# Patient Record
Sex: Female | Born: 1955 | Race: White | Hispanic: No | Marital: Single | Smoking: Current every day smoker
Health system: Southern US, Community
[De-identification: ages and names within clinical notes are randomized; demographics above are authoritative.]

## PROBLEM LIST (undated history)

## (undated) DIAGNOSIS — I10 Essential (primary) hypertension: Secondary | ICD-10-CM

## (undated) DIAGNOSIS — I82409 Acute embolism and thrombosis of unspecified deep veins of unspecified lower extremity: Secondary | ICD-10-CM

## (undated) HISTORY — PX: APPENDECTOMY: SHX54

---

## 2009-05-14 ENCOUNTER — Ambulatory Visit: Payer: Self-pay | Admitting: Internal Medicine

## 2009-08-03 ENCOUNTER — Ambulatory Visit: Payer: Self-pay | Admitting: Family Medicine

## 2011-12-26 DIAGNOSIS — F3130 Bipolar disorder, current episode depressed, mild or moderate severity, unspecified: Secondary | ICD-10-CM | POA: Diagnosis not present

## 2012-04-20 DIAGNOSIS — F3130 Bipolar disorder, current episode depressed, mild or moderate severity, unspecified: Secondary | ICD-10-CM | POA: Diagnosis not present

## 2012-08-19 DIAGNOSIS — F3130 Bipolar disorder, current episode depressed, mild or moderate severity, unspecified: Secondary | ICD-10-CM | POA: Diagnosis not present

## 2013-03-17 DIAGNOSIS — F3130 Bipolar disorder, current episode depressed, mild or moderate severity, unspecified: Secondary | ICD-10-CM | POA: Diagnosis not present

## 2013-07-15 DIAGNOSIS — Z882 Allergy status to sulfonamides status: Secondary | ICD-10-CM | POA: Diagnosis not present

## 2013-07-15 DIAGNOSIS — Z79899 Other long term (current) drug therapy: Secondary | ICD-10-CM | POA: Diagnosis not present

## 2013-07-15 DIAGNOSIS — R109 Unspecified abdominal pain: Secondary | ICD-10-CM | POA: Diagnosis not present

## 2013-07-15 DIAGNOSIS — Z885 Allergy status to narcotic agent status: Secondary | ICD-10-CM | POA: Diagnosis not present

## 2013-07-15 DIAGNOSIS — F319 Bipolar disorder, unspecified: Secondary | ICD-10-CM | POA: Diagnosis not present

## 2013-07-15 DIAGNOSIS — K5000 Crohn's disease of small intestine without complications: Secondary | ICD-10-CM | POA: Diagnosis not present

## 2013-07-15 DIAGNOSIS — I499 Cardiac arrhythmia, unspecified: Secondary | ICD-10-CM | POA: Diagnosis not present

## 2013-07-15 DIAGNOSIS — K5289 Other specified noninfective gastroenteritis and colitis: Secondary | ICD-10-CM | POA: Diagnosis not present

## 2013-07-15 DIAGNOSIS — K37 Unspecified appendicitis: Secondary | ICD-10-CM | POA: Diagnosis not present

## 2013-07-15 DIAGNOSIS — F17200 Nicotine dependence, unspecified, uncomplicated: Secondary | ICD-10-CM | POA: Diagnosis not present

## 2013-07-15 DIAGNOSIS — R1033 Periumbilical pain: Secondary | ICD-10-CM | POA: Diagnosis not present

## 2013-07-17 DIAGNOSIS — R509 Fever, unspecified: Secondary | ICD-10-CM | POA: Diagnosis not present

## 2013-07-17 DIAGNOSIS — F17200 Nicotine dependence, unspecified, uncomplicated: Secondary | ICD-10-CM | POA: Diagnosis not present

## 2013-07-17 DIAGNOSIS — F319 Bipolar disorder, unspecified: Secondary | ICD-10-CM | POA: Insufficient documentation

## 2013-07-17 DIAGNOSIS — K5289 Other specified noninfective gastroenteritis and colitis: Secondary | ICD-10-CM | POA: Diagnosis not present

## 2013-07-17 DIAGNOSIS — K529 Noninfective gastroenteritis and colitis, unspecified: Secondary | ICD-10-CM | POA: Insufficient documentation

## 2013-07-17 DIAGNOSIS — R109 Unspecified abdominal pain: Secondary | ICD-10-CM | POA: Diagnosis not present

## 2013-07-17 DIAGNOSIS — Z79899 Other long term (current) drug therapy: Secondary | ICD-10-CM | POA: Diagnosis not present

## 2013-07-17 DIAGNOSIS — Z886 Allergy status to analgesic agent status: Secondary | ICD-10-CM | POA: Diagnosis not present

## 2013-07-17 DIAGNOSIS — K37 Unspecified appendicitis: Secondary | ICD-10-CM | POA: Diagnosis not present

## 2013-07-17 DIAGNOSIS — Z882 Allergy status to sulfonamides status: Secondary | ICD-10-CM | POA: Diagnosis not present

## 2013-07-17 DIAGNOSIS — Z885 Allergy status to narcotic agent status: Secondary | ICD-10-CM | POA: Diagnosis not present

## 2013-07-18 DIAGNOSIS — K5289 Other specified noninfective gastroenteritis and colitis: Secondary | ICD-10-CM | POA: Diagnosis not present

## 2013-09-14 DIAGNOSIS — F3130 Bipolar disorder, current episode depressed, mild or moderate severity, unspecified: Secondary | ICD-10-CM | POA: Diagnosis not present

## 2013-09-17 DIAGNOSIS — Z8371 Family history of colonic polyps: Secondary | ICD-10-CM | POA: Diagnosis not present

## 2013-09-17 DIAGNOSIS — K625 Hemorrhage of anus and rectum: Secondary | ICD-10-CM | POA: Diagnosis not present

## 2013-09-17 DIAGNOSIS — K5289 Other specified noninfective gastroenteritis and colitis: Secondary | ICD-10-CM | POA: Diagnosis not present

## 2013-11-04 DIAGNOSIS — K5289 Other specified noninfective gastroenteritis and colitis: Secondary | ICD-10-CM | POA: Diagnosis not present

## 2013-11-16 DIAGNOSIS — F3130 Bipolar disorder, current episode depressed, mild or moderate severity, unspecified: Secondary | ICD-10-CM | POA: Diagnosis not present

## 2013-12-13 DIAGNOSIS — E079 Disorder of thyroid, unspecified: Secondary | ICD-10-CM | POA: Diagnosis not present

## 2013-12-13 DIAGNOSIS — D128 Benign neoplasm of rectum: Secondary | ICD-10-CM | POA: Diagnosis not present

## 2013-12-13 DIAGNOSIS — F319 Bipolar disorder, unspecified: Secondary | ICD-10-CM | POA: Diagnosis not present

## 2013-12-13 DIAGNOSIS — F17200 Nicotine dependence, unspecified, uncomplicated: Secondary | ICD-10-CM | POA: Diagnosis not present

## 2013-12-13 DIAGNOSIS — Z1211 Encounter for screening for malignant neoplasm of colon: Secondary | ICD-10-CM | POA: Diagnosis not present

## 2013-12-13 DIAGNOSIS — D126 Benign neoplasm of colon, unspecified: Secondary | ICD-10-CM | POA: Diagnosis not present

## 2013-12-13 DIAGNOSIS — Z9089 Acquired absence of other organs: Secondary | ICD-10-CM | POA: Diagnosis not present

## 2013-12-13 DIAGNOSIS — Z885 Allergy status to narcotic agent status: Secondary | ICD-10-CM | POA: Diagnosis not present

## 2013-12-13 DIAGNOSIS — Z882 Allergy status to sulfonamides status: Secondary | ICD-10-CM | POA: Diagnosis not present

## 2013-12-13 DIAGNOSIS — Z79899 Other long term (current) drug therapy: Secondary | ICD-10-CM | POA: Diagnosis not present

## 2014-05-09 DIAGNOSIS — F3130 Bipolar disorder, current episode depressed, mild or moderate severity, unspecified: Secondary | ICD-10-CM | POA: Diagnosis not present

## 2014-10-19 ENCOUNTER — Ambulatory Visit: Payer: Self-pay | Admitting: Emergency Medicine

## 2014-10-19 DIAGNOSIS — J329 Chronic sinusitis, unspecified: Secondary | ICD-10-CM | POA: Diagnosis not present

## 2014-10-19 DIAGNOSIS — F17210 Nicotine dependence, cigarettes, uncomplicated: Secondary | ICD-10-CM | POA: Diagnosis not present

## 2014-10-19 DIAGNOSIS — J40 Bronchitis, not specified as acute or chronic: Secondary | ICD-10-CM | POA: Diagnosis not present

## 2014-11-02 DIAGNOSIS — Z23 Encounter for immunization: Secondary | ICD-10-CM | POA: Diagnosis not present

## 2014-11-02 DIAGNOSIS — F3131 Bipolar disorder, current episode depressed, mild: Secondary | ICD-10-CM | POA: Diagnosis not present

## 2015-05-03 DIAGNOSIS — F3131 Bipolar disorder, current episode depressed, mild: Secondary | ICD-10-CM | POA: Diagnosis not present

## 2015-05-03 DIAGNOSIS — Z79899 Other long term (current) drug therapy: Secondary | ICD-10-CM | POA: Diagnosis not present

## 2015-07-05 DIAGNOSIS — F3131 Bipolar disorder, current episode depressed, mild: Secondary | ICD-10-CM | POA: Diagnosis not present

## 2015-07-05 DIAGNOSIS — Z79899 Other long term (current) drug therapy: Secondary | ICD-10-CM | POA: Diagnosis not present

## 2015-09-12 DIAGNOSIS — Z23 Encounter for immunization: Secondary | ICD-10-CM | POA: Diagnosis not present

## 2016-01-03 DIAGNOSIS — F3131 Bipolar disorder, current episode depressed, mild: Secondary | ICD-10-CM | POA: Diagnosis not present

## 2016-08-05 DIAGNOSIS — F3131 Bipolar disorder, current episode depressed, mild: Secondary | ICD-10-CM | POA: Diagnosis not present

## 2017-02-04 DIAGNOSIS — F3131 Bipolar disorder, current episode depressed, mild: Secondary | ICD-10-CM | POA: Diagnosis not present

## 2017-07-14 DIAGNOSIS — Z79899 Other long term (current) drug therapy: Secondary | ICD-10-CM | POA: Diagnosis not present

## 2017-07-14 DIAGNOSIS — Z5181 Encounter for therapeutic drug level monitoring: Secondary | ICD-10-CM | POA: Diagnosis not present

## 2017-07-14 DIAGNOSIS — F319 Bipolar disorder, unspecified: Secondary | ICD-10-CM | POA: Diagnosis not present

## 2017-07-14 DIAGNOSIS — F17210 Nicotine dependence, cigarettes, uncomplicated: Secondary | ICD-10-CM | POA: Diagnosis not present

## 2017-07-14 DIAGNOSIS — Z7689 Persons encountering health services in other specified circumstances: Secondary | ICD-10-CM | POA: Diagnosis not present

## 2017-07-14 DIAGNOSIS — Z131 Encounter for screening for diabetes mellitus: Secondary | ICD-10-CM | POA: Diagnosis not present

## 2017-07-14 DIAGNOSIS — Z1159 Encounter for screening for other viral diseases: Secondary | ICD-10-CM | POA: Diagnosis not present

## 2017-07-14 DIAGNOSIS — Z720 Tobacco use: Secondary | ICD-10-CM | POA: Insufficient documentation

## 2017-07-14 DIAGNOSIS — Z114 Encounter for screening for human immunodeficiency virus [HIV]: Secondary | ICD-10-CM | POA: Diagnosis not present

## 2017-07-14 DIAGNOSIS — R221 Localized swelling, mass and lump, neck: Secondary | ICD-10-CM | POA: Diagnosis not present

## 2017-07-14 DIAGNOSIS — Z1231 Encounter for screening mammogram for malignant neoplasm of breast: Secondary | ICD-10-CM | POA: Diagnosis not present

## 2017-07-14 DIAGNOSIS — Z13220 Encounter for screening for lipoid disorders: Secondary | ICD-10-CM | POA: Diagnosis not present

## 2017-08-18 DIAGNOSIS — F3131 Bipolar disorder, current episode depressed, mild: Secondary | ICD-10-CM | POA: Diagnosis not present

## 2021-02-19 ENCOUNTER — Other Ambulatory Visit (INDEPENDENT_AMBULATORY_CARE_PROVIDER_SITE_OTHER): Payer: Self-pay | Admitting: Vascular Surgery

## 2021-02-19 DIAGNOSIS — C9400 Acute erythroid leukemia, not having achieved remission: Secondary | ICD-10-CM

## 2021-02-19 DIAGNOSIS — R0989 Other specified symptoms and signs involving the circulatory and respiratory systems: Secondary | ICD-10-CM

## 2021-02-20 ENCOUNTER — Other Ambulatory Visit: Payer: Self-pay

## 2021-02-20 ENCOUNTER — Ambulatory Visit (INDEPENDENT_AMBULATORY_CARE_PROVIDER_SITE_OTHER): Payer: Medicare PPO

## 2021-02-20 ENCOUNTER — Encounter (INDEPENDENT_AMBULATORY_CARE_PROVIDER_SITE_OTHER): Payer: Self-pay | Admitting: Vascular Surgery

## 2021-02-20 ENCOUNTER — Ambulatory Visit (INDEPENDENT_AMBULATORY_CARE_PROVIDER_SITE_OTHER): Payer: Medicare PPO | Admitting: Vascular Surgery

## 2021-02-20 VITALS — BP 164/67 | HR 75 | Ht 66.0 in | Wt 168.0 lb

## 2021-02-20 DIAGNOSIS — I7025 Atherosclerosis of native arteries of other extremities with ulceration: Secondary | ICD-10-CM | POA: Diagnosis not present

## 2021-02-20 DIAGNOSIS — C9400 Acute erythroid leukemia, not having achieved remission: Secondary | ICD-10-CM

## 2021-02-20 DIAGNOSIS — Z720 Tobacco use: Secondary | ICD-10-CM

## 2021-02-20 DIAGNOSIS — R0989 Other specified symptoms and signs involving the circulatory and respiratory systems: Secondary | ICD-10-CM

## 2021-02-20 NOTE — Assessment & Plan Note (Signed)
Represents an atherosclerotic risk factor and smoking cessation would be of benefit both for reduction of progression of disease as well as maintaining patency of any intervention.

## 2021-02-20 NOTE — Progress Notes (Signed)
Patient ID: Kristen Frazier, female   DOB: [birth year], 65 y.o.   MRN: [id number]  Chief Complaint  Patient presents with  . New Patient (Initial Visit)    Acute  erythematous rt foot decreased pulse U/ and consult referred by Maurilio Lovely    HPI Kristen Frazier is a 65 y.o. female.  I am asked to see the patient by Nigel Mormon, PA-C for evaluation of right foot pain and erythema.  The patient says she dropped a pickle jar on her foot about 6 or 7 months ago and has had pain in that foot ever since.  Over the past few weeks, the pain is intensified.  She has developed erythema and started on antibiotics about a week ago.  When asked if she has pain in her legs with walking prior to this episode, she said she did in fact in her right leg.  This starts after only short distances of walking.  The pain does wake her from sleep.  She does have a history of tobacco use and concern for PAD was present.  ABIs were done today which showed very poor monophasic waveforms on the right with an ABI of 0.4.  She has strong monophasic waveforms on the left but her ABI was still significantly reduced to 0.49.  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   Past Medical History History of tobacco use Asthma Bipolar disorder   Family History No bleeding disorders, clotting disorders, autoimmune diseases, or aneurysms   Social History   Tobacco Use  . Smoking status: Smoker, Current Status Unknown  . Smokeless tobacco: Never Used  no alcohol abuse No IVDU  Allergies  Allergen Reactions  . Codeine Nausea And Vomiting  . Oxycodone-Acetaminophen Nausea And Vomiting  . Sulfa Antibiotics Nausea And Vomiting    Current Outpatient Medications  Medication Sig Dispense Refill  . albuterol (VENTOLIN HFA) 108 (90 Base) MCG/ACT inhaler Inhale into the lungs.    . doxycycline (VIBRA-TABS) 100 MG tablet Take by mouth.    . lamoTRIgine (LAMICTAL) 200 MG tablet Take by mouth.    . QUEtiapine  (SEROQUEL) 400 MG tablet Take by mouth.     No current facility-administered medications for this visit.      REVIEW OF SYSTEMS (Negative unless checked)  Constitutional: [] Weight loss  [] Fever  [] Chills Cardiac: [] Chest pain   [] Chest pressure   [] Palpitations   [] Shortness of breath when laying flat   [] Shortness of breath at rest   [] Shortness of breath with exertion. Vascular:  [] Pain in legs with walking   [] Pain in legs at rest   [] Pain in legs when laying flat   [] Claudication   [x] Pain in feet when walking  [x] Pain in feet at rest  [x] Pain in feet when laying flat   [] History of DVT   [] Phlebitis   [] Swelling in legs   [] Varicose veins   [x] Non-healing ulcers Pulmonary:   [] Uses home oxygen   [] Productive cough   [] Hemoptysis   [] Wheeze  [] COPD   [] Asthma Neurologic:  [] Dizziness  [] Blackouts   [x] Seizures   [] History of stroke   [] History of TIA  [] Aphasia   [] Temporary blindness   [] Dysphagia   [] Weakness or numbness in arms   [] Weakness or numbness in legs Musculoskeletal:  [] Arthritis   [] Joint swelling   [] Joint pain   [] Low back pain Hematologic:  [] Easy bruising  [] Easy bleeding   [] Hypercoagulable state   [] Anemic  [] Hepatitis Gastrointestinal:  [] Blood in stool   []   Vomiting blood  [] Gastroesophageal reflux/heartburn   [] Abdominal pain Genitourinary:  [] Chronic kidney disease   [] Difficult urination  [] Frequent urination  [] Burning with urination   [] Hematuria Skin:  [] Rashes   [x] Ulcers   [x] Wounds Psychological:  [] History of anxiety   [x]  History of major depression.    Physical Exam BP (!) 164/67   Pulse 75   Ht 5\' 6"  (1.676 m)   Wt 168 lb (76.2 kg)   BMI 27.12 kg/m  Gen:  WD/WN, NAD Head: [place]/AT, No temporalis wasting.  Ear/Nose/Throat: Hearing grossly intact, nares w/o erythema or drainage, oropharynx w/o Erythema/Exudate Eyes: Conjunctiva clear, sclera non-icteric  Neck: trachea midline.  No JVD.  Pulmonary:  Good air movement, respirations not labored, no use  of accessory muscles  Cardiac: RRR, no JVD Vascular:  Vessel Right Left  Radial Palpable Palpable                          DP  not palpable  not palpable  PT  not palpable  1+   Gastrointestinal:. No masses, surgical incisions, or scars. Musculoskeletal: M/S 5/5 throughout.  Right foot has significant erythema up to just above the ankle.  Capillary refill is somewhat sluggish on both sides.  Trace lower extremity edema Neurologic: Sensation grossly intact in extremities.  Symmetrical.  Speech is fluent. Motor exam as listed above. Psychiatric: Judgment intact, Mood & affect appropriate for pt's clinical situation. Dermatologic: No rashes or ulcers noted.  No cellulitis or open wounds.    Radiology No results found.  Labs No results found for this or any previous visit (from the past 2160 hour(s)).  Assessment/Plan:  Atherosclerosis of native arteries of the extremities with ulceration (HCC) ABIs were done today which showed very poor monophasic waveforms on the right with an ABI of 0.4.  She has strong monophasic waveforms on the left but her ABI was still significantly reduced to 0.49.  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 The patient has a critical and limb threatening situation with a high risk of limb loss.  Her right leg is in immediate jeopardy.  Her perfusion is fairly reduced on the left as well, but this is not an immediate limb threatening situation as her skin is intact without infection.  I discussed the pathophysiology and natural history of peripheral arterial disease.  I discussed the reason and rationale for treatment and the fact that this is unlikely to improve without revascularization.  I have discussed that if disease is found at the time of angiogram and is amenable, endovascular revascularization will be performed.  Have also discussed the severe disease open surgical therapy may be required as well.  The patient voices her understanding  and is agreeable to proceed.  This will be scheduled in the near future at her convenience.  Tobacco use Represents an atherosclerotic risk factor and smoking cessation would be of benefit both for reduction of progression of disease as well as maintaining patency of any intervention.      Leotis Pain 02/20/2021, 12:19 PM   This note was created with Dragon medical transcription system.  Any errors from dictation are unintentional.

## 2021-02-20 NOTE — H&P (View-Only) (Signed)
Patient ID: Kristen Frazier, female   DOB: 1956-06-12, 65 y.o.   MRN: [id number]  Chief Complaint  Patient presents with  . New Patient (Initial Visit)    Acute  erythematous rt foot decreased pulse U/ and consult referred by Maurilio Lovely    HPI Kristen Frazier is a 65 y.o. female.  I am asked to see the patient by Nigel Mormon, PA-C for evaluation of right foot pain and erythema.  The patient says she dropped a pickle jar on her foot about 6 or 7 months ago and has had pain in that foot ever since.  Over the past few weeks, the pain is intensified.  She has developed erythema and started on antibiotics about a week ago.  When asked if she has pain in her legs with walking prior to this episode, she said she did in fact in her right leg.  This starts after only short distances of walking.  The pain does wake her from sleep.  She does have a history of tobacco use and concern for PAD was present.  ABIs were done today which showed very poor monophasic waveforms on the right with an ABI of 0.4.  She has strong monophasic waveforms on the left but her ABI was still significantly reduced to 0.49.  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   Past Medical History History of tobacco use Asthma Bipolar disorder   Family History No bleeding disorders, clotting disorders, autoimmune diseases, or aneurysms   Social History   Tobacco Use  . Smoking status: Smoker, Current Status Unknown  . Smokeless tobacco: Never Used  no alcohol abuse No IVDU  Allergies  Allergen Reactions  . Codeine Nausea And Vomiting  . Oxycodone-Acetaminophen Nausea And Vomiting  . Sulfa Antibiotics Nausea And Vomiting    Current Outpatient Medications  Medication Sig Dispense Refill  . albuterol (VENTOLIN HFA) 108 (90 Base) MCG/ACT inhaler Inhale into the lungs.    . doxycycline (VIBRA-TABS) 100 MG tablet Take by mouth.    . lamoTRIgine (LAMICTAL) 200 MG tablet Take by mouth.    . QUEtiapine  (SEROQUEL) 400 MG tablet Take by mouth.     No current facility-administered medications for this visit.      REVIEW OF SYSTEMS (Negative unless checked)  Constitutional: [] Weight loss  [] Fever  [] Chills Cardiac: [] Chest pain   [] Chest pressure   [] Palpitations   [] Shortness of breath when laying flat   [] Shortness of breath at rest   [] Shortness of breath with exertion. Vascular:  [] Pain in legs with walking   [] Pain in legs at rest   [] Pain in legs when laying flat   [] Claudication   [x] Pain in feet when walking  [x] Pain in feet at rest  [x] Pain in feet when laying flat   [] History of DVT   [] Phlebitis   [] Swelling in legs   [] Varicose veins   [x] Non-healing ulcers Pulmonary:   [] Uses home oxygen   [] Productive cough   [] Hemoptysis   [] Wheeze  [] COPD   [] Asthma Neurologic:  [] Dizziness  [] Blackouts   [x] Seizures   [] History of stroke   [] History of TIA  [] Aphasia   [] Temporary blindness   [] Dysphagia   [] Weakness or numbness in arms   [] Weakness or numbness in legs Musculoskeletal:  [] Arthritis   [] Joint swelling   [] Joint pain   [] Low back pain Hematologic:  [] Easy bruising  [] Easy bleeding   [] Hypercoagulable state   [] Anemic  [] Hepatitis Gastrointestinal:  [] Blood in stool   []   Vomiting blood  [] Gastroesophageal reflux/heartburn   [] Abdominal pain Genitourinary:  [] Chronic kidney disease   [] Difficult urination  [] Frequent urination  [] Burning with urination   [] Hematuria Skin:  [] Rashes   [x] Ulcers   [x] Wounds Psychological:  [] History of anxiety   [x]  History of major depression.    Physical Exam BP (!) 164/67   Pulse 75   Ht 5\' 6"  (1.676 m)   Wt 168 lb (76.2 kg)   BMI 27.12 kg/m  Gen:  WD/WN, NAD Head: [place]/AT, No temporalis wasting.  Ear/Nose/Throat: Hearing grossly intact, nares w/o erythema or drainage, oropharynx w/o Erythema/Exudate Eyes: Conjunctiva clear, sclera non-icteric  Neck: trachea midline.  No JVD.  Pulmonary:  Good air movement, respirations not labored, no use  of accessory muscles  Cardiac: RRR, no JVD Vascular:  Vessel Right Left  Radial Palpable Palpable                          DP  not palpable  not palpable  PT  not palpable  1+   Gastrointestinal:. No masses, surgical incisions, or scars. Musculoskeletal: M/S 5/5 throughout.  Right foot has significant erythema up to just above the ankle.  Capillary refill is somewhat sluggish on both sides.  Trace lower extremity edema Neurologic: Sensation grossly intact in extremities.  Symmetrical.  Speech is fluent. Motor exam as listed above. Psychiatric: Judgment intact, Mood & affect appropriate for pt's clinical situation. Dermatologic: No rashes or ulcers noted.  No cellulitis or open wounds.    Radiology No results found.  Labs No results found for this or any previous visit (from the past 2160 hour(s)).  Assessment/Plan:  Atherosclerosis of native arteries of the extremities with ulceration (HCC) ABIs were done today which showed very poor monophasic waveforms on the right with an ABI of 0.4.  She has strong monophasic waveforms on the left but her ABI was still significantly reduced to 0.49.  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 The patient has a critical and limb threatening situation with a high risk of limb loss.  Her right leg is in immediate jeopardy.  Her perfusion is fairly reduced on the left as well, but this is not an immediate limb threatening situation as her skin is intact without infection.  I discussed the pathophysiology and natural history of peripheral arterial disease.  I discussed the reason and rationale for treatment and the fact that this is unlikely to improve without revascularization.  I have discussed that if disease is found at the time of angiogram and is amenable, endovascular revascularization will be performed.  Have also discussed the severe disease open surgical therapy may be required as well.  The patient voices her understanding  and is agreeable to proceed.  This will be scheduled in the near future at her convenience.  Tobacco use Represents an atherosclerotic risk factor and smoking cessation would be of benefit both for reduction of progression of disease as well as maintaining patency of any intervention.      Leotis Pain 02/20/2021, 12:19 PM   This note was created with Dragon medical transcription system.  Any errors from dictation are unintentional.

## 2021-02-20 NOTE — Patient Instructions (Signed)
Peripheral Vascular Disease  Peripheral vascular disease (PVD) is a disease of the blood vessels that carry blood from the heart to the rest of the body. PVD is also called peripheral artery disease (PAD) or poor circulation. PVD affects most of the body. But it affects the legs and feet the most. PVD can lead to acute limb ischemia. This happens when there is a sudden stop of blood flow to an arm or leg. This is a medical emergency. What are the causes? The most common cause of PVD is a buildup of a fatty substance (plaque) inside your arteries. This decreases blood flow. Plaque can break off and block blood in a smaller artery. This can lead to acute limb ischemia. Other common causes of PVD include:  Blood clots inside the blood vessels.  Injuries to blood vessels.  Irritation and swelling of blood vessels.  Sudden tightening of the blood vessel (spasms). What increases the risk?  A family history of PVD.  Medical conditions, including: ? High cholesterol. ? Diabetes. ? High blood pressure. ? Heart disease. ? Past problems with blood clots. ? Past injury, such as burns or a broken bone.  Other conditions, such as: ? Buerger's disease. This is caused by swollen or irritated blood vessels in your hands and feet. ? Arthritis. ? Birth defects that affect the arteries in your legs. ? Kidney disease.  Using tobacco or nicotine products.  Not getting enough exercise.  Being very overweight (obese).  Being 67 years old or older. What are the signs or symptoms?  Cramps in your butt, legs, and feet.  Pain and weakness in your legs when you are active that goes away when you rest.  Leg pain when at rest.  Leg numbness, tingling, or weakness.  Coldness in a leg or foot, especially when compared with the other leg or foot.  Skin or hair changes. These can include: ? Hair loss. ? Shiny skin. ? Pale or bluish skin. ? Thick toenails.  Being unable to get or keep an  erection.  Tiredness (fatigue).  Weak pulse or no pulse in the feet.  Wounds and sores on the toes, feet, or legs. These take longer to heal. How is this treated? Underlying causes are treated first. Other conditions, like diabetes, high cholesterol, and blood pressure, are also treated. Treatment may include:  Lifestyle changes, such as: ? Quitting smoking. ? Getting regular exercise. ? Having a diet low in fat and cholesterol. ? Not drinking alcohol.  Taking medicines, such as: ? Blood thinners. ? Medicines to improve blood flow. ? Medicines to improve your blood cholesterol.  Procedures to: ? Open the arteries and restore blood flow. ? Insert a small mesh tube (stent) to keep a blocked vessel open. ? Create a new path for blood to flow to the body (peripheral bypass). ? Remove dead tissue from a wound. ? Remove an affected leg or arm. Follow these instructions at home: Medicines  Take over-the-counter and prescription medicines only as told by your doctor.  If you are taking blood thinners: ? Talk with your doctor before you take any medicines that have aspirin, or NSAIDs, such as ibuprofen. ? Take medicines exactly as told. Take them at the same time each day. ? Avoid doing things that could hurt or bruise you. Take action to prevent falls. ? Wear an alert bracelet or carry a card that shows you are taking blood thinners. Lifestyle  Get regular exercise. Ask your doctor about how to stay active.    Talk with your doctor about keeping a healthy weight. If needed, ask about losing weight.  Eat a diet that is low in fat and cholesterol. If you need help, talk with your doctor.  Do not drink alcohol.  Do not smoke or use any products that contain nicotine or tobacco. If you need help quitting, ask your doctor.      General instructions  Take good care of your feet. To do this: ? Wear shoes that fit well and feel good. ? Check your feet often for any cuts or  sores.  Get a flu shot (influenza vaccine) each year.  Keep all follow-up visits. Where to find more information  Society for Vascular Surgery: vascular.org  American Heart Association: heart.org  National Heart, Lung, and Blood Institute: nhlbi.nih.gov Contact a doctor if:  You have cramps in your legs when you walk.  You have leg pain when you rest.  Your leg or foot feels cold.  Your skin changes.  You cannot get or keep an erection.  You have cuts or sores on your legs or feet that do not heal. Get help right away if:  You have sudden changes in the color and feeling of your arms or legs, such as: ? Your arm or leg turns cold, numb, and blue. ? Your arm or leg becomes red, warm, swollen, painful, or numb.  You have any signs of a stroke. "BE FAST" is an easy way to remember the main warning signs: ? B - Balance. Dizziness, sudden trouble walking, or loss of balance. ? E - Eyes. Trouble seeing or a change in how you see. ? F - Face. Sudden weakness or loss of feeling of the face. The face or eyelid may droop on one side. ? A - Arms. Weakness or loss of feeling in an arm. This happens all of a sudden and most often on one side of the body. ? S - Speech. Sudden trouble speaking, slurred speech, or trouble understanding what people say. ? T - Time. Time to call emergency services. Write down what time symptoms started.  You have other signs of a stroke, such as: ? A sudden, very bad headache with no known cause. ? Feeling like you may vomit (nausea). ? Vomiting. ? A seizure.  You have chest pain or trouble breathing. These symptoms may be an emergency. Get help right away. Call your local emergency services (911 in the [place]  Do not wait to see if the symptoms will go away.  Do not drive yourself to the hospital. Summary  Peripheral vascular disease (PVD) is a disease of the blood vessels.  PVD affects the legs and feet the most.  Symptoms may include leg  pain or leg numbness, tingling, and weakness.  Treatment may include lifestyle changes, medicines, and procedures. This information is not intended to replace advice given to you by your health care provider. Make sure you discuss any questions you have with your health care provider. Document Revised: 05/08/2020 Document Reviewed: 05/08/2020 Elsevier Patient Education  2021 [employer]  

## 2021-02-20 NOTE — Assessment & Plan Note (Signed)
ABIs were done today which showed very poor monophasic waveforms on the right with an ABI of 0.4.  She has strong monophasic waveforms on the left but her ABI was still significantly reduced to 0.49.  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 The patient has a critical and limb threatening situation with a high risk of limb loss.  Her right leg is in immediate jeopardy.  Her perfusion is fairly reduced on the left as well, but this is not an immediate limb threatening situation as her skin is intact without infection.  I discussed the pathophysiology and natural history of peripheral arterial disease.  I discussed the reason and rationale for treatment and the fact that this is unlikely to improve without revascularization.  I have discussed that if disease is found at the time of angiogram and is amenable, endovascular revascularization will be performed.  Have also discussed the severe disease open surgical therapy may be required as well.  The patient voices her understanding and is agreeable to proceed.  This will be scheduled in the near future at her convenience.

## 2021-02-21 ENCOUNTER — Telehealth (INDEPENDENT_AMBULATORY_CARE_PROVIDER_SITE_OTHER): Payer: Self-pay

## 2021-02-21 NOTE — Telephone Encounter (Signed)
Spoke with the patient and she is scheduled with Dr. Lucky Cowboy for a right leg angio with a 8:30 am arrival time to the [facility] Covid testing on 02/27/21 between 8-2 pm at the [facility]. Pre-procedure instructions were discussed and will be mailed.

## 2021-02-27 ENCOUNTER — Other Ambulatory Visit
Admission: RE | Admit: 2021-02-27 | Discharge: 2021-02-27 | Disposition: A | Discharge status: Home / Self Care | Payer: Medicare PPO | Source: Ambulatory Visit | Attending: Vascular Surgery | Admitting: Vascular Surgery

## 2021-02-27 ENCOUNTER — Other Ambulatory Visit: Payer: Self-pay

## 2021-02-27 ENCOUNTER — Other Ambulatory Visit (INDEPENDENT_AMBULATORY_CARE_PROVIDER_SITE_OTHER): Payer: Self-pay | Admitting: Nurse Practitioner

## 2021-02-27 DIAGNOSIS — Z20822 Contact with and (suspected) exposure to covid-19: Secondary | ICD-10-CM | POA: Insufficient documentation

## 2021-02-27 DIAGNOSIS — Z01812 Encounter for preprocedural laboratory examination: Secondary | ICD-10-CM | POA: Insufficient documentation

## 2021-02-28 LAB — SARS CORONAVIRUS 2 (TAT 6-24 HRS): SARS Coronavirus 2: NEGATIVE

## 2021-03-01 ENCOUNTER — Ambulatory Visit
Admission: RE | Admit: 2021-03-01 | Discharge: 2021-03-01 | Disposition: A | Discharge status: Home / Self Care | Payer: Medicare PPO | Source: Home / Self Care | Attending: Vascular Surgery | Admitting: Vascular Surgery

## 2021-03-01 ENCOUNTER — Inpatient Hospital Stay
Admission: EM | Admit: 2021-03-01 | Discharge: 2021-03-02 | DRG: 271 | Disposition: A | Discharge status: Home / Self Care | Payer: Medicare PPO | Attending: Vascular Surgery | Admitting: Vascular Surgery

## 2021-03-01 ENCOUNTER — Encounter
Admission: RE | Disposition: A | Discharge status: Home / Self Care | Payer: Self-pay | Source: Home / Self Care | Attending: Vascular Surgery

## 2021-03-01 ENCOUNTER — Encounter
Admission: EM | Disposition: A | Discharge status: Home / Self Care | Payer: Self-pay | Source: Home / Self Care | Attending: Vascular Surgery

## 2021-03-01 ENCOUNTER — Encounter: Payer: Self-pay | Admitting: *Deleted

## 2021-03-01 ENCOUNTER — Encounter: Payer: Self-pay | Admitting: Vascular Surgery

## 2021-03-01 ENCOUNTER — Other Ambulatory Visit: Payer: Self-pay

## 2021-03-01 DIAGNOSIS — I70299 Other atherosclerosis of native arteries of extremities, unspecified extremity: Secondary | ICD-10-CM

## 2021-03-01 DIAGNOSIS — Z882 Allergy status to sulfonamides status: Secondary | ICD-10-CM | POA: Insufficient documentation

## 2021-03-01 DIAGNOSIS — I708 Atherosclerosis of other arteries: Secondary | ICD-10-CM | POA: Diagnosis present

## 2021-03-01 DIAGNOSIS — F17200 Nicotine dependence, unspecified, uncomplicated: Secondary | ICD-10-CM | POA: Insufficient documentation

## 2021-03-01 DIAGNOSIS — Z885 Allergy status to narcotic agent status: Secondary | ICD-10-CM | POA: Insufficient documentation

## 2021-03-01 DIAGNOSIS — J439 Emphysema, unspecified: Secondary | ICD-10-CM | POA: Diagnosis present

## 2021-03-01 DIAGNOSIS — Z79899 Other long term (current) drug therapy: Secondary | ICD-10-CM | POA: Diagnosis not present

## 2021-03-01 DIAGNOSIS — I743 Embolism and thrombosis of arteries of the lower extremities: Secondary | ICD-10-CM | POA: Diagnosis present

## 2021-03-01 DIAGNOSIS — Z20822 Contact with and (suspected) exposure to covid-19: Secondary | ICD-10-CM | POA: Diagnosis present

## 2021-03-01 DIAGNOSIS — I7025 Atherosclerosis of native arteries of other extremities with ulceration: Secondary | ICD-10-CM | POA: Diagnosis present

## 2021-03-01 DIAGNOSIS — R200 Anesthesia of skin: Secondary | ICD-10-CM | POA: Diagnosis present

## 2021-03-01 DIAGNOSIS — I959 Hypotension, unspecified: Secondary | ICD-10-CM | POA: Diagnosis not present

## 2021-03-01 DIAGNOSIS — F319 Bipolar disorder, unspecified: Secondary | ICD-10-CM | POA: Diagnosis present

## 2021-03-01 DIAGNOSIS — I70221 Atherosclerosis of native arteries of extremities with rest pain, right leg: Secondary | ICD-10-CM | POA: Insufficient documentation

## 2021-03-01 DIAGNOSIS — E785 Hyperlipidemia, unspecified: Secondary | ICD-10-CM | POA: Diagnosis present

## 2021-03-01 DIAGNOSIS — M79673 Pain in unspecified foot: Secondary | ICD-10-CM | POA: Diagnosis present

## 2021-03-01 DIAGNOSIS — I10 Essential (primary) hypertension: Secondary | ICD-10-CM | POA: Diagnosis present

## 2021-03-01 DIAGNOSIS — L97909 Non-pressure chronic ulcer of unspecified part of unspecified lower leg with unspecified severity: Secondary | ICD-10-CM

## 2021-03-01 DIAGNOSIS — Z9889 Other specified postprocedural states: Secondary | ICD-10-CM | POA: Diagnosis not present

## 2021-03-01 DIAGNOSIS — I998 Other disorder of circulatory system: Secondary | ICD-10-CM

## 2021-03-01 DIAGNOSIS — I70212 Atherosclerosis of native arteries of extremities with intermittent claudication, left leg: Secondary | ICD-10-CM

## 2021-03-01 HISTORY — PX: LOWER EXTREMITY ANGIOGRAPHY: CATH118251

## 2021-03-01 LAB — BASIC METABOLIC PANEL
Anion gap: 9 (ref 5–15)
BUN: 16 mg/dL (ref 8–23)
CO2: 23 mmol/L (ref 22–32)
Calcium: 9.1 mg/dL (ref 8.9–10.3)
Chloride: 106 mmol/L (ref 98–111)
Creatinine, Ser: 0.75 mg/dL (ref 0.44–1.00)
GFR, Estimated: >60 mL/min (ref >60)
Glucose, Bld: 121 mg/dL — ABNORMAL HIGH (ref 70–99)
Potassium: 3.9 mmol/L (ref 3.5–5.1)
Sodium: 138 mmol/L (ref 135–145)

## 2021-03-01 LAB — CBC WITH DIFFERENTIAL/PLATELET
Abs Immature Granulocytes: 0.06 10*3/uL (ref 0.00–0.07)
Basophils Absolute: 0 10*3/uL (ref 0.0–0.1)
Basophils Relative: 0 %
Eosinophils Absolute: 0 10*3/uL (ref 0.0–0.5)
Eosinophils Relative: 0 %
HCT: 45.8 % (ref 36.0–46.0)
Hemoglobin: 15.5 g/dL — ABNORMAL HIGH (ref 12.0–15.0)
Immature Granulocytes: 0 %
Lymphocytes Relative: 10 %
Lymphs Abs: 1.4 10*3/uL (ref 0.7–4.0)
MCH: 32.4 pg (ref 26.0–34.0)
MCHC: 33.8 g/dL (ref 30.0–36.0)
MCV: 95.6 fL (ref 80.0–100.0)
Monocytes Absolute: 0.7 10*3/uL (ref 0.1–1.0)
Monocytes Relative: 5 %
Neutro Abs: 11.5 10*3/uL — ABNORMAL HIGH (ref 1.7–7.7)
Neutrophils Relative %: 85 %
Platelets: 252 10*3/uL (ref 150–400)
RBC: 4.79 MIL/uL (ref 3.87–5.11)
RDW: 13.9 % (ref 11.5–15.5)
WBC: 13.7 10*3/uL — ABNORMAL HIGH (ref 4.0–10.5)
nRBC: 0 % (ref 0.0–0.2)

## 2021-03-01 LAB — RESP PANEL BY RT-PCR (FLU A&B, COVID) ARPGX2
Influenza A by PCR: NEGATIVE
Influenza B by PCR: NEGATIVE
SARS Coronavirus 2 by RT PCR: NEGATIVE

## 2021-03-01 LAB — PROTIME-INR
INR: 1 (ref 0.8–1.2)
Prothrombin Time: 12.7 seconds (ref 11.4–15.2)

## 2021-03-01 LAB — CREATININE, SERUM
Creatinine, Ser: 0.92 mg/dL (ref 0.44–1.00)
GFR, Estimated: >60 mL/min (ref >60)

## 2021-03-01 LAB — GLUCOSE, CAPILLARY: Glucose-Capillary: 116 mg/dL — ABNORMAL HIGH (ref 70–99)

## 2021-03-01 LAB — BUN: BUN: 16 mg/dL (ref 8–23)

## 2021-03-01 SURGERY — LOWER EXTREMITY ANGIOGRAPHY
Anesthesia: Moderate Sedation | Laterality: Right

## 2021-03-01 SURGERY — LOWER EXTREMITY ANGIOGRAPHY
Anesthesia: Moderate Sedation | Site: Leg Lower | Laterality: Right

## 2021-03-01 MED ORDER — ONDANSETRON HCL 4 MG/2ML IJ SOLN: 4.0000 mg | Freq: Four times a day (QID) | INTRAMUSCULAR | Status: DC | PRN

## 2021-03-01 MED ORDER — SODIUM CHLORIDE 0.9% FLUSH: 3.0000 mL | INTRAVENOUS | Status: DC | PRN

## 2021-03-01 MED ORDER — ASPIRIN EC 81 MG PO TBEC: 81.0000 mg | DELAYED_RELEASE_TABLET | Freq: Every day | ORAL | Status: DC

## 2021-03-01 MED ORDER — SODIUM CHLORIDE 0.9 % IV SOLN: INTRAVENOUS | Status: DC

## 2021-03-01 MED ORDER — SODIUM CHLORIDE 0.9% FLUSH: 3.0000 mL | Freq: Two times a day (BID) | INTRAVENOUS | Status: DC

## 2021-03-01 MED ORDER — SODIUM CHLORIDE 0.9 % IV SOLN: 250.0000 mL | INTRAVENOUS | Status: DC | PRN

## 2021-03-01 MED ORDER — TIROFIBAN (AGGRASTAT) BOLUS VIA INFUSION
25.0000 ug/kg | INTRAVENOUS | Status: AC
  Administered 2021-03-01: 1870 ug via INTRAVENOUS
  Filled 2021-03-01: qty 38

## 2021-03-01 MED ORDER — HEPARIN (PORCINE) 25000 UT/250ML-% IV SOLN: 1300.0000 [IU]/h | INTRAVENOUS | Status: DC

## 2021-03-01 MED ORDER — ATORVASTATIN CALCIUM 10 MG PO TABS: 10.0000 mg | ORAL_TABLET | Freq: Every day | ORAL | 11 refills | Status: AC

## 2021-03-01 MED ORDER — ACETAMINOPHEN 325 MG PO TABS
650.0000 mg | ORAL_TABLET | ORAL | Status: DC | PRN
  Administered 2021-03-02 (×2): 650 mg via ORAL
  Filled 2021-03-01 (×2): qty 2

## 2021-03-01 MED ORDER — CHLORHEXIDINE GLUCONATE CLOTH 2 % EX PADS: 6.0000 | MEDICATED_PAD | Freq: Every day | CUTANEOUS | Status: DC

## 2021-03-01 MED ORDER — QUETIAPINE FUMARATE 200 MG PO TABS
400.0000 mg | ORAL_TABLET | Freq: Every day | ORAL | Status: DC
  Administered 2021-03-02: 400 mg via ORAL
  Filled 2021-03-01: qty 2

## 2021-03-01 MED ORDER — LABETALOL HCL 5 MG/ML IV SOLN
10.0000 mg | INTRAVENOUS | Status: DC | PRN
Start: 2021-03-01 — End: 2021-03-01

## 2021-03-01 MED ORDER — FAMOTIDINE 20 MG PO TABS: 40.0000 mg | ORAL_TABLET | Freq: Once | ORAL | Status: DC | PRN

## 2021-03-01 MED ORDER — ASPIRIN EC 81 MG PO TBEC
81.0000 mg | DELAYED_RELEASE_TABLET | Freq: Every day | ORAL | Status: DC
  Administered 2021-03-02: 81 mg via ORAL
  Filled 2021-03-01: qty 1

## 2021-03-01 MED ORDER — LAMOTRIGINE 100 MG PO TABS
400.0000 mg | ORAL_TABLET | Freq: Every day | ORAL | Status: DC
  Administered 2021-03-02: 400 mg via ORAL
  Filled 2021-03-01 (×2): qty 4

## 2021-03-01 MED ORDER — ATORVASTATIN CALCIUM 10 MG PO TABS
10.0000 mg | ORAL_TABLET | Freq: Every day | ORAL | Status: DC
  Administered 2021-03-02: 10 mg via ORAL
  Filled 2021-03-01: qty 1

## 2021-03-01 MED ORDER — ATORVASTATIN CALCIUM 10 MG PO TABS: 10.0000 mg | ORAL_TABLET | Freq: Every day | ORAL | Status: DC

## 2021-03-01 MED ORDER — ASPIRIN EC 81 MG PO TBEC: 81.0000 mg | DELAYED_RELEASE_TABLET | Freq: Every day | ORAL | 2 refills | Status: DC

## 2021-03-01 MED ORDER — TRAMADOL HCL 50 MG PO TABS: 50.0000 mg | ORAL_TABLET | Freq: Four times a day (QID) | ORAL | Status: DC | PRN

## 2021-03-01 MED ORDER — HYDROMORPHONE HCL 1 MG/ML IJ SOLN: 1.0000 mg | Freq: Once | INTRAMUSCULAR | Status: AC | PRN

## 2021-03-01 MED ORDER — HEPARIN SODIUM (PORCINE) 1000 UNIT/ML IJ SOLN
INTRAMUSCULAR | Status: AC
  Filled 2021-03-01: qty 1

## 2021-03-01 MED ORDER — MORPHINE SULFATE (PF) 4 MG/ML IV SOLN: 4.0000 mg | INTRAVENOUS | Status: DC | PRN

## 2021-03-01 MED ORDER — MIDAZOLAM HCL 5 MG/5ML IJ SOLN
INTRAMUSCULAR | Status: AC
  Filled 2021-03-01: qty 5

## 2021-03-01 MED ORDER — HYDRALAZINE HCL 20 MG/ML IJ SOLN: 5.0000 mg | INTRAMUSCULAR | Status: DC | PRN

## 2021-03-01 MED ORDER — MORPHINE SULFATE (PF) 4 MG/ML IV SOLN: 2.0000 mg | INTRAVENOUS | Status: DC | PRN

## 2021-03-01 MED ORDER — HYDROMORPHONE HCL 1 MG/ML IJ SOLN
INTRAMUSCULAR | Status: AC
  Administered 2021-03-01: 1 mg via INTRAVENOUS
  Filled 2021-03-01: qty 1

## 2021-03-01 MED ORDER — ALBUTEROL SULFATE HFA 108 (90 BASE) MCG/ACT IN AERS
2.0000 | INHALATION_SPRAY | RESPIRATORY_TRACT | Status: DC | PRN
  Filled 2021-03-01: qty 6.7

## 2021-03-01 MED ORDER — HEPARIN SODIUM (PORCINE) 1000 UNIT/ML IJ SOLN
INTRAMUSCULAR | Status: DC | PRN
  Administered 2021-03-01: 5000 [IU] via INTRAVENOUS

## 2021-03-01 MED ORDER — LABETALOL HCL 5 MG/ML IV SOLN
INTRAVENOUS | Status: AC
  Filled 2021-03-01: qty 4

## 2021-03-01 MED ORDER — MIDAZOLAM HCL 2 MG/2ML IJ SOLN
INTRAMUSCULAR | Status: DC | PRN
  Administered 2021-03-01: 2 mg via INTRAVENOUS

## 2021-03-01 MED ORDER — LABETALOL HCL 5 MG/ML IV SOLN
INTRAVENOUS | Status: DC | PRN
  Administered 2021-03-01: 10 mg via INTRAVENOUS

## 2021-03-01 MED ORDER — FENTANYL CITRATE (PF) 100 MCG/2ML IJ SOLN
INTRAMUSCULAR | Status: AC
  Filled 2021-03-01: qty 2

## 2021-03-01 MED ORDER — ONDANSETRON HCL 4 MG/2ML IJ SOLN
INTRAMUSCULAR | Status: AC
  Filled 2021-03-01: qty 2

## 2021-03-01 MED ORDER — TIROFIBAN HCL IN NACL 5-0.9 MG/100ML-% IV SOLN
0.1500 ug/kg/min | INTRAVENOUS | Status: DC
  Administered 2021-03-01 – 2021-03-02 (×3): 0.15 ug/kg/min via INTRAVENOUS
  Filled 2021-03-01 (×3): qty 100

## 2021-03-01 MED ORDER — CLOPIDOGREL BISULFATE 75 MG PO TABS: 75.0000 mg | ORAL_TABLET | Freq: Every day | ORAL | Status: DC

## 2021-03-01 MED ORDER — LACTATED RINGERS IV SOLN: INTRAVENOUS | Status: DC

## 2021-03-01 MED ORDER — CLOPIDOGREL BISULFATE 75 MG PO TABS: 75.0000 mg | ORAL_TABLET | Freq: Every day | ORAL | 11 refills | Status: DC

## 2021-03-01 MED ORDER — SODIUM CHLORIDE 0.9 % IV SOLN: INTRAVENOUS | Status: AC

## 2021-03-01 MED ORDER — MIDAZOLAM HCL 2 MG/2ML IJ SOLN
INTRAMUSCULAR | Status: DC | PRN
  Administered 2021-03-01: 1 mg via INTRAVENOUS
  Administered 2021-03-01: 2 mg via INTRAVENOUS

## 2021-03-01 MED ORDER — ONDANSETRON HCL 4 MG/2ML IJ SOLN
4.0000 mg | Freq: Four times a day (QID) | INTRAMUSCULAR | Status: DC | PRN
  Filled 2021-03-01: qty 2

## 2021-03-01 MED ORDER — LABETALOL HCL 5 MG/ML IV SOLN: 10.0000 mg | INTRAVENOUS | Status: DC | PRN

## 2021-03-01 MED ORDER — CEFAZOLIN SODIUM-DEXTROSE 2-4 GM/100ML-% IV SOLN
2.0000 g | Freq: Once | INTRAVENOUS | Status: AC
  Administered 2021-03-01: 2 g via INTRAVENOUS

## 2021-03-01 MED ORDER — CEFAZOLIN SODIUM-DEXTROSE 2-4 GM/100ML-% IV SOLN
2.0000 g | INTRAVENOUS | Status: AC
  Administered 2021-03-01: 2 g via INTRAVENOUS
  Filled 2021-03-01: qty 100

## 2021-03-01 MED ORDER — DIPHENHYDRAMINE HCL 50 MG/ML IJ SOLN: 50.0000 mg | Freq: Once | INTRAMUSCULAR | Status: DC | PRN

## 2021-03-01 MED ORDER — HYDRALAZINE HCL 20 MG/ML IJ SOLN
INTRAMUSCULAR | Status: DC | PRN
  Administered 2021-03-01: 10 mg via INTRAVENOUS

## 2021-03-01 MED ORDER — HYDRALAZINE HCL 20 MG/ML IJ SOLN
INTRAMUSCULAR | Status: AC
  Filled 2021-03-01: qty 1

## 2021-03-01 MED ORDER — METHYLPREDNISOLONE SODIUM SUCC 125 MG IJ SOLR: 125.0000 mg | Freq: Once | INTRAMUSCULAR | Status: DC | PRN

## 2021-03-01 MED ORDER — FENTANYL CITRATE (PF) 100 MCG/2ML IJ SOLN
INTRAMUSCULAR | Status: DC | PRN
  Administered 2021-03-01: 50 ug via INTRAVENOUS
  Administered 2021-03-01 (×3): 25 ug via INTRAVENOUS
  Administered 2021-03-01: 50 ug via INTRAVENOUS

## 2021-03-01 MED ORDER — FENTANYL CITRATE (PF) 100 MCG/2ML IJ SOLN
INTRAMUSCULAR | Status: DC | PRN
  Administered 2021-03-01 (×2): 50 ug via INTRAVENOUS

## 2021-03-01 MED ORDER — IODIXANOL 320 MG/ML IV SOLN
INTRAVENOUS | Status: DC | PRN
  Administered 2021-03-01: 40 mL

## 2021-03-01 MED ORDER — CEFAZOLIN SODIUM-DEXTROSE 2-4 GM/100ML-% IV SOLN
INTRAVENOUS | Status: AC
  Filled 2021-03-01: qty 100

## 2021-03-01 MED ORDER — MIDAZOLAM HCL 2 MG/ML PO SYRP: 8.0000 mg | ORAL_SOLUTION | Freq: Once | ORAL | Status: DC | PRN

## 2021-03-01 MED ORDER — IODIXANOL 320 MG/ML IV SOLN
INTRAVENOUS | Status: DC | PRN
  Administered 2021-03-01: 95 mL via INTRA_ARTERIAL

## 2021-03-01 MED ORDER — ONDANSETRON HCL 4 MG/2ML IJ SOLN
4.0000 mg | Freq: Four times a day (QID) | INTRAMUSCULAR | Status: DC | PRN
  Administered 2021-03-01: 4 mg via INTRAVENOUS

## 2021-03-01 MED ORDER — SODIUM CHLORIDE 0.9% FLUSH
3.0000 mL | Freq: Two times a day (BID) | INTRAVENOUS | Status: DC
  Administered 2021-03-02: 3 mL via INTRAVENOUS

## 2021-03-01 MED ORDER — HEPARIN BOLUS VIA INFUSION
5000.0000 [IU] | Freq: Once | INTRAVENOUS | Status: DC
  Filled 2021-03-01: qty 5000

## 2021-03-01 SURGICAL SUPPLY — 19 items
CANISTER PENUMBRA ENGINE (MISCELLANEOUS) ×2 IMPLANT
CANNULA 5F STIFF (CANNULA) ×2 IMPLANT
CATH INDIGO CAT6 KIT (CATHETERS) ×2 IMPLANT
CATH INFUS 135CMX50CM (CATHETERS) ×2 IMPLANT
CATH TEMPO 5F RIM 65CM (CATHETERS) ×2 IMPLANT
CATH VERT 5FR 125CM (CATHETERS) ×2 IMPLANT
COVER PROBE U/S 5X48 (MISCELLANEOUS) ×2 IMPLANT
DEVICE SAFEGUARD 24CM (GAUZE/BANDAGES/DRESSINGS) ×2 IMPLANT
DEVICE STARCLOSE SE CLOSURE (Vascular Products) ×2 IMPLANT
DEVICE TORQUE (MISCELLANEOUS) ×2 IMPLANT
GLIDEWIRE ADV .014X300CM (WIRE) ×2 IMPLANT
GLIDEWIRE ADV .035X260CM (WIRE) ×2 IMPLANT
KIT CV MULTILUMEN 7FR 20 (SET/KITS/TRAYS/PACK) ×2
KIT CV MULTILUMEN 7FR 20 SUB (SET/KITS/TRAYS/PACK) ×1 IMPLANT
KIT ENCORE 26 ADVANTAGE (KITS) IMPLANT
PACK ANGIOGRAPHY (CUSTOM PROCEDURE TRAY) ×2 IMPLANT
SHEATH BRITE TIP 5FRX11 (SHEATH) ×2 IMPLANT
SHEATH DESTIN RDC 6FR 45 (SHEATH) ×2 IMPLANT
WIRE GUIDERIGHT .035X150 (WIRE) ×2 IMPLANT

## 2021-03-01 SURGICAL SUPPLY — 27 items
BALLN LUTONIX 018 4X80X130 (BALLOONS) ×2
BALLN LUTONIX 018 5X220X130 (BALLOONS) ×2
BALLN LUTONIX 5X220X130 (BALLOONS) ×2
BALLN LUTONIX DCB 6X60X130 (BALLOONS) ×2
BALLOON LUTONIX 018 4X80X130 (BALLOONS) ×1 IMPLANT
BALLOON LUTONIX 018 5X220X130 (BALLOONS) ×1 IMPLANT
BALLOON LUTONIX 5X220X130 (BALLOONS) ×1 IMPLANT
BALLOON LUTONIX DCB 6X60X130 (BALLOONS) ×1 IMPLANT
CATH ANGIO 5F PIGTAIL 65CM (CATHETERS) ×2 IMPLANT
CATH BEACON 5 .035 40 KMP TP (CATHETERS) ×1 IMPLANT
CATH BEACON 5 .038 100 VERT TP (CATHETERS) ×2 IMPLANT
CATH BEACON 5 .038 40 KMP TP (CATHETERS) ×1
COVER PROBE U/S 5X48 (MISCELLANEOUS) ×2 IMPLANT
DEVICE STARCLOSE SE CLOSURE (Vascular Products) ×2 IMPLANT
GLIDEWIRE ADV .035X180CM (WIRE) ×2 IMPLANT
GLIDEWIRE ADV .035X260CM (WIRE) ×2 IMPLANT
INTRODUCER 7FR 23CM (INTRODUCER) ×2 IMPLANT
KIT ENCORE 26 ADVANTAGE (KITS) ×2 IMPLANT
PACK ANGIOGRAPHY (CUSTOM PROCEDURE TRAY) ×2 IMPLANT
SHEATH ANL2 6FRX45 HC (SHEATH) ×2 IMPLANT
SHEATH BRITE TIP 5FRX11 (SHEATH) ×2 IMPLANT
STENT LIFESTAR 9X40 (Permanent Stent) ×2 IMPLANT
STENT LIFESTREAM 8X37X80 (Permanent Stent) ×4 IMPLANT
STENT VIABAHN 6X250X120 (Permanent Stent) ×2 IMPLANT
TUBING CONTRAST HIGH PRESS 72 (TUBING) ×2 IMPLANT
WIRE G V18X300CM (WIRE) ×2 IMPLANT
WIRE GUIDERIGHT .035X150 (WIRE) ×2 IMPLANT

## 2021-03-01 NOTE — ED Provider Notes (Signed)
[facility] Emergency Department Provider Note   ____________________________________________   Event Date/Time   First MD Initiated Contact with Patient 03/01/21 1936     (approximate)  I have reviewed the triage vital signs and the nursing notes.   HISTORY  Chief Complaint Circulatory Problem    HPI Kristen Frazier is a 65 y.o. female with possible history of peripheral arterial disease and bipolar disorder who presents to the ED complaining of foot pain.  Patient reports that she had angioplasty and stenting with Dr. Lucky Cowboy earlier this afternoon, was feeling fine around the time of discharge at 2 PM.  She states shortly after returning home she felt nauseous and vomited, afterwards noticed increasing pain to her right foot.  She states the right foot feels cold compared to the left with numbness and burning pain.  She initially called an ambulance and was taken to [facility] but while waiting in the waiting room, had her sister bring her to the ED at [facility].        No past medical history on file.  Patient Active Problem List   Diagnosis Date Noted  . Atherosclerosis of native arteries of the extremities with ulceration ([place]) 02/20/2021  . Tobacco use 07/14/2017  . Bipolar affective disorder ([place]) 07/17/2013  . Colitis 07/17/2013    Past Surgical History:  Procedure Laterality Date  . APPENDECTOMY    . LOWER EXTREMITY ANGIOGRAPHY Right 03/01/2021   Procedure: LOWER EXTREMITY ANGIOGRAPHY;  Surgeon: Algernon Huxley, MD;  Location: [facility];  Service: Cardiovascular;  Laterality: Right;    Prior to Admission medications   Medication Sig Start Date End Date Taking? Authorizing Provider  albuterol (VENTOLIN HFA) 108 (90 Base) MCG/ACT inhaler Inhale into the lungs. 06/23/19   [provider]  aspirin EC 81 MG tablet Take 1 tablet (81 mg total) by mouth daily. 03/01/21   Algernon Huxley, MD  atorvastatin (LIPITOR) 10 MG tablet Take 1 tablet  (10 mg total) by mouth daily. 03/01/21 03/01/22  Algernon Huxley, MD  clopidogrel (PLAVIX) 75 MG tablet Take 1 tablet (75 mg total) by mouth daily. 03/01/21   Algernon Huxley, MD  lamoTRIgine (LAMICTAL) 200 MG tablet Take by mouth. 10/27/20   [provider]  QUEtiapine (SEROQUEL) 400 MG tablet Take by mouth.    [provider]    Allergies Codeine, Oxycodone-acetaminophen, and Sulfa antibiotics  No family history on file.  Social History Social History   Tobacco Use  . Smoking status: Current Every Day Smoker  . Smokeless tobacco: Never Used  Substance Use Topics  . Alcohol use: Never  . Drug use: Never    Review of Systems  Constitutional: No fever/chills Eyes: No visual changes. ENT: No sore throat. Cardiovascular: Denies chest pain. Respiratory: Denies shortness of breath. Gastrointestinal: No abdominal pain.  No nausea, no vomiting.  No diarrhea.  No constipation. Genitourinary: Negative for dysuria. Musculoskeletal: Negative for back pain.  Positive for foot pain. Skin: Negative for rash. Neurological: Negative for headaches, focal weakness or numbness.  ____________________________________________   PHYSICAL EXAM:  VITAL SIGNS: ED Triage Vitals  Enc Vitals Group     BP 03/01/21 1925 (!) 179/83     Pulse Rate 03/01/21 1925 76     Resp 03/01/21 1925 16     Temp 03/01/21 1925 98 F (36.7 C)     Temp Source 03/01/21 1925 Oral     SpO2 03/01/21 1925 100 %  Weight 03/01/21 1925 165 lb (74.8 kg)     Height 03/01/21 1925 5\' 5"  (1.651 m)     Head Circumference --      Peak Flow --      Pain Score 03/01/21 1933 10     Pain Loc --      Pain Edu? --      Excl. in [place]? --     Constitutional: Alert and oriented. Eyes: Conjunctivae are normal. Head: Atraumatic. Nose: No congestion/rhinnorhea. Mouth/Throat: Mucous membranes are moist. Neck: Normal ROM Cardiovascular: Normal rate, regular rhythm. Grossly normal heart sounds.  2+ DP and PT pulse on  the left, no palpable or dopplerable PT or DP pulses on right.  Cap refill delayed in right toes. Respiratory: Normal respiratory effort.  No retractions. Lungs CTAB. Gastrointestinal: Soft and nontender. No distention. Genitourinary: deferred Musculoskeletal: No lower extremity tenderness nor edema. Neurologic:  Normal speech and language. No gross focal neurologic deficits are appreciated. Skin:  Skin is warm, dry and intact. No rash noted. Psychiatric: Mood and affect are normal. Speech and behavior are normal.  ____________________________________________   LABS (all labs ordered are listed, but only abnormal results are displayed)  Labs Reviewed  CBC WITH DIFFERENTIAL/PLATELET - Abnormal; Notable for the following components:      Result Value   WBC 13.7 (*)    Hemoglobin 15.5 (*)    Neutro Abs 11.5 (*)    All other components within normal limits  RESP PANEL BY RT-PCR (FLU A&B, COVID) ARPGX2  BASIC METABOLIC PANEL  PROTIME-INR     PROCEDURES  Procedure(s) performed (including Critical Care):  .Critical Care Performed by: Blake Divine, MD Authorized by: Blake Divine, MD   Critical care provider statement:    Critical care time (minutes):  45   Critical care time was exclusive of:  Separately billable procedures and treating other patients and teaching time   Critical care was necessary to treat or prevent imminent or life-threatening deterioration of the following conditions:  Circulatory failure   Critical care was time spent personally by me on the following activities:  Discussions with consultants, evaluation of patient's response to treatment, examination of patient, ordering and performing treatments and interventions, ordering and review of laboratory studies, ordering and review of radiographic studies, pulse oximetry, re-evaluation of patient's condition, obtaining history from patient or surrogate and review of old charts   I assumed direction of critical  care for this patient from another provider in my specialty: no     Care discussed with: admitting provider       ____________________________________________   INITIAL IMPRESSION / [place] / ED COURSE       65 year old female with past medical history of peripheral arterial disease and bipolar disorder who presents to the ED with acute onset right foot pain with numbness after having angioplasty and stenting to bilateral lower extremities earlier today.  Her right foot does seem cold to touch compared to the left with no palpable or dopplerable pulses.  Given concern for ischemia, case discussed with Dr. Delana Meyer of vascular surgery, who will evaluate the patient.  We will start patient on heparin for now.  Patient evaluated by Dr. Delana Meyer of vascular surgery, who will take patient for emergent intervention.      ____________________________________________   FINAL CLINICAL IMPRESSION(S) / ED DIAGNOSES  Final diagnoses:  Limb ischemia     ED Discharge Orders    None       Note:  This document was  prepared using Systems analyst and may include unintentional dictation errors.   Blake Divine, MD 03/01/21 2000

## 2021-03-01 NOTE — Consult Note (Incomplete)
NAME:  Kristen Frazier, MRN:  [id number], DOB:  [birth year], LOS: 0 ADMISSION DATE:  03/01/2021, CONSULTATION DATE:  03/01/21 REFERRING MD:  Delana Meyer, CHIEF COMPLAINT:  Leg pain   History of Present Illness:  65 year old woman with hx of smoking who presented with worsening right foot pain and ulceration found to have severe PVD of RLE.  She underwent elective right multiple angioplasties on R including stent to right SFA, externial iliac, internal iliac stenting earlier today.  Unfortunately shortly after discharge developed severe pain and numbness on right and came back to ER.  Found to have extensive in stent thromboses without culprit lesion/stenoses.  Cleared out most clot by Dr. Delana Meyer and brought to ICU on aggrastat overnight.  Procedure complicated by severe hypertension requiring multiple PRNs.  Pertinent  Medical History  COPD Smoker PVD Bipolar  Significant Hospital Events: Including procedures, antibiotic start and stop dates in addition to other pertinent events   . 4/14 OP RLE angiography with multiple interventions including stent placement . 4/14 return to ER for ischemic RLE found to have multiple clots along RLE arterial system cleaned out  Interim History / Subjective:  Consulted  Objective   Blood pressure (!) 133/56, pulse 68, temperature 98.1 F (36.7 C), temperature source Oral, resp. rate 17, height 5' 5.5" (1.664 m), weight 74.8 kg, SpO2 94 %.       No intake or output data in the 24 hours ending 03/01/21 2316 Filed Weights   03/01/21 1925 03/01/21 1933 03/01/21 2034  Weight: 74.8 kg 74.8 kg 74.8 kg    Examination: General: *** HENT: *** Lungs: *** Cardiovascular: *** Abdomen: *** Extremities: *** Neuro: *** Skin: ***  Labs/imaging that I havepersonally reviewed  (right click and "Reselect all SmartList Selections" daily)  K slightly low Cr okay WBC/HGb slightly up  Resolved Hospital Problem list   n/a  Assessment & Plan:  Ischemic RLE  after multiple RLE stents placed earlier today with extensive thrombosis found unclear culprit- brought to ICU on antiplatelet drip for close neurovascular checks to assure no recurrence. - Neurovascular checks and antiplatelet agents per vascular - Control BP, pain with meds as ordered - Gentle IV hydration after multiple IV contrast loads  Intra-op hypertension- hopefully just related to pain - Treat pain first, if BP remains elevated use PRN antihypertensives, goal SBP <160  Hx COPD- albuterol PRN  Bipolar hx- continue PTA meds  Best practice (right click and "Reselect all SmartList Selections" daily)  Per primary  Labs   CBC: Recent Labs  Lab 03/01/21 1949  WBC 13.7*  NEUTROABS 11.5*  HGB 15.5*  HCT 45.8  MCV 95.6  PLT 828    Basic Metabolic Panel: Recent Labs  Lab 03/01/21 0834 03/01/21 1949  NA  --  138  K  --  3.9  CL  --  106  CO2  --  23  GLUCOSE  --  121*  BUN 16 16  CREATININE 0.92 0.75  CALCIUM  --  9.1   GFR: Estimated Creatinine Clearance: 70.8 mL/min (by C-G formula based on SCr of 0.75 mg/dL). Recent Labs  Lab 03/01/21 1949  WBC 13.7*    Liver Function Tests: No results for input(s): AST, ALT, ALKPHOS, BILITOT, PROT, ALBUMIN in the last 168 hours. No results for input(s): LIPASE, AMYLASE in the last 168 hours. No results for input(s): AMMONIA in the last 168 hours.  ABG No results found for: PHART, PCO2ART, PO2ART, HCO3, TCO2, ACIDBASEDEF, O2SAT   Coagulation Profile:  Recent Labs  Lab 03/01/21 1949  INR 1.0    Cardiac Enzymes: No results for input(s): CKTOTAL, CKMB, CKMBINDEX, TROPONINI in the last 168 hours.  HbA1C: No results found for: HGBA1C  CBG: No results for input(s): GLUCAP in the last 168 hours.  Review of Systems:   ***  Past Medical History:  She,  has no past medical history on file.   Surgical History:   Past Surgical History:  Procedure Laterality Date  . APPENDECTOMY    . LOWER EXTREMITY ANGIOGRAPHY  Right 03/01/2021   Procedure: LOWER EXTREMITY ANGIOGRAPHY;  Surgeon: Algernon Huxley, MD;  Location: [facility];  Service: Cardiovascular;  Laterality: Right;     Social History:   reports that she has been smoking. She has never used smokeless tobacco. She reports that she does not drink alcohol and does not use drugs.   Family History:  Her family history is not on file.   Allergies Allergies  Allergen Reactions  . Codeine Nausea And Vomiting  . Oxycodone-Acetaminophen Nausea And Vomiting  . Sulfa Antibiotics Nausea And Vomiting     Home Medications  Prior to Admission medications   Medication Sig Start Date End Date Taking? Authorizing Provider  albuterol (VENTOLIN HFA) 108 (90 Base) MCG/ACT inhaler Inhale 2 puffs into the lungs every 4 (four) hours as needed for wheezing or shortness of breath. 06/23/19  Yes [provider]  lamoTRIgine (LAMICTAL) 200 MG tablet Take 400 mg by mouth at bedtime. 10/27/20  Yes [provider]  QUEtiapine (SEROQUEL) 200 MG tablet Take 400 mg by mouth at bedtime. 02/11/21  Yes [provider]  aspirin EC 81 MG tablet Take 1 tablet (81 mg total) by mouth daily. 03/01/21   Algernon Huxley, MD  atorvastatin (LIPITOR) 10 MG tablet Take 1 tablet (10 mg total) by mouth daily. 03/01/21 03/01/22  Algernon Huxley, MD  clopidogrel (PLAVIX) 75 MG tablet Take 1 tablet (75 mg total) by mouth daily. 03/01/21   Algernon Huxley, MD

## 2021-03-01 NOTE — Consult Note (Signed)
NAME:  Kristen Frazier, MRN:  [id number], DOB:  Oct 26, 1956, LOS: 0 ADMISSION DATE:  03/01/2021, CONSULTATION DATE:  03/01/21 REFERRING MD:  Delana Meyer, CHIEF COMPLAINT:  Leg pain   History of Present Illness:  65 year old woman with hx of smoking who presented with worsening right foot pain and ulceration found to have severe PVD of RLE.  She underwent elective right multiple angioplasties on R including stent to right SFA, externial iliac, internal iliac stenting earlier today.  Unfortunately shortly after discharge developed severe pain and numbness on right and came back to ER.  Found to have extensive in stent thromboses without culprit lesion/stenoses.  Cleared out most clot by Dr. Delana Meyer and brought to ICU on aggrastat overnight.  Procedure complicated by severe hypertension requiring multiple PRNs.  Pertinent  Medical History  COPD Smoker PVD Bipolar  Significant Hospital Events: Including procedures, antibiotic start and stop dates in addition to other pertinent events   . 4/14 OP RLE angiography with multiple interventions including stent placement . 4/14 return to ER for ischemic RLE found to have multiple clots along RLE arterial system cleaned out  Interim History / Subjective:  Consulted  Objective   Blood pressure (!) 132/56, pulse 72, temperature 98 F (36.7 C), temperature source Oral, resp. rate (!) 21, height 5' 5.5" (1.664 m), weight 74.8 kg, SpO2 90 %.       No intake or output data in the 24 hours ending 03/01/21 2336 Filed Weights   03/01/21 1925 03/01/21 1933 03/01/21 2034  Weight: 74.8 kg 74.8 kg 74.8 kg    Examination: General: no acute distress laying in bed HENT: MM dry, trachea midline Lungs: diminished without accessory muscle use Cardiovascular: RRR, ext warm Abdomen: soft, hypoactive BS Extremities: R groin soft, L groin with pressure dressing in place, both ext with good pulses Neuro: moves all 4 ext to command Skin: slightly pale, no  rashes  Labs/imaging that I havepersonally reviewed  (right click and "Reselect all SmartList Selections" daily)  K slightly low Cr okay WBC/HGb slightly up  Resolved Hospital Problem list   n/a  Assessment & Plan:  Ischemic RLE after multiple RLE stents placed earlier today with extensive thrombosis found unclear culprit- brought to ICU on antiplatelet drip for close neurovascular checks to assure no recurrence. - Neurovascular checks and antiplatelet agents per vascular - Control BP, pain with meds as ordered - Gentle IV hydration after multiple IV contrast loads  Intra-op hypertension- hopefully just related to pain - Treat pain first, if BP remains elevated use PRN antihypertensives, goal SBP <160 - Premedicate opiates with zofran as she gets nauseous with this  Hx COPD- albuterol PRN  Bipolar hx- continue PTA meds  Best practice (right click and "Reselect all SmartList Selections" daily)  Per primary  Labs   CBC: Recent Labs  Lab 03/01/21 1949  WBC 13.7*  NEUTROABS 11.5*  HGB 15.5*  HCT 45.8  MCV 95.6  PLT 182    Basic Metabolic Panel: Recent Labs  Lab 03/01/21 0834 03/01/21 1949  NA  --  138  K  --  3.9  CL  --  106  CO2  --  23  GLUCOSE  --  121*  BUN 16 16  CREATININE 0.92 0.75  CALCIUM  --  9.1   GFR: Estimated Creatinine Clearance: 70.8 mL/min (by C-G formula based on SCr of 0.75 mg/dL). Recent Labs  Lab 03/01/21 1949  WBC 13.7*    Liver Function Tests: No results for  input(s): AST, ALT, ALKPHOS, BILITOT, PROT, ALBUMIN in the last 168 hours. No results for input(s): LIPASE, AMYLASE in the last 168 hours. No results for input(s): AMMONIA in the last 168 hours.  ABG No results found for: PHART, PCO2ART, PO2ART, HCO3, TCO2, ACIDBASEDEF, O2SAT   Coagulation Profile: Recent Labs  Lab 03/01/21 1949  INR 1.0    Cardiac Enzymes: No results for input(s): CKTOTAL, CKMB, CKMBINDEX, TROPONINI in the last 168 hours.  HbA1C: No results  found for: HGBA1C  CBG: Recent Labs  Lab 03/01/21 2329  GLUCAP 116*    Review of Systems:    Positive Symptoms in bold:  Constitutional fevers, chills, weight loss, fatigue, anorexia, malaise  Eyes decreased vision, double vision, eye irritation  Ears, Nose, Mouth, Throat sore throat, trouble swallowing, sinus congestion  Cardiovascular chest pain, paroxysmal nocturnal dyspnea, lower ext edema, palpitations   Respiratory SOB, cough, DOE, hemoptysis, wheezing  Gastrointestinal nausea, vomiting, diarrhea  Genitourinary burning with urination, trouble urinating  Musculoskeletal joint aches, joint swelling, back pain  Integumentary  rashes, skin lesions  Neurological focal weakness, focal numbness, trouble speaking, headaches  Psychiatric depression, anxiety, confusion  Endocrine polyuria, polydipsia, cold intolerance, heat intolerance  Hematologic abnormal bruising, abnormal bleeding, unexplained nose bleeds  Allergic/Immunologic recurrent infections, hives, swollen lymph nodes     Past Medical History:  She,  has no past medical history on file.   Surgical History:   Past Surgical History:  Procedure Laterality Date  . APPENDECTOMY    . LOWER EXTREMITY ANGIOGRAPHY Right 03/01/2021   Procedure: LOWER EXTREMITY ANGIOGRAPHY;  Surgeon: Algernon Huxley, MD;  Location: [place] [facility];  Service: Cardiovascular;  Laterality: Right;     Social History:   reports that she has been smoking. She has never used smokeless tobacco. She reports that she does not drink alcohol and does not use drugs.   Family History:  Her family history is not on file.   Allergies Allergies  Allergen Reactions  . Codeine Nausea And Vomiting  . Oxycodone-Acetaminophen Nausea And Vomiting  . Sulfa Antibiotics Nausea And Vomiting     Home Medications  Prior to Admission medications   Medication Sig Start Date End Date Taking? Authorizing Provider  albuterol (VENTOLIN HFA) 108 (90 Base)  MCG/ACT inhaler Inhale 2 puffs into the lungs every 4 (four) hours as needed for wheezing or shortness of breath. 06/23/19  Yes [provider]  lamoTRIgine (LAMICTAL) 200 MG tablet Take 400 mg by mouth at bedtime. 10/27/20  Yes [provider]  QUEtiapine (SEROQUEL) 200 MG tablet Take 400 mg by mouth at bedtime. 02/11/21  Yes [provider]  aspirin EC 81 MG tablet Take 1 tablet (81 mg total) by mouth daily. 03/01/21   Algernon Huxley, MD  atorvastatin (LIPITOR) 10 MG tablet Take 1 tablet (10 mg total) by mouth daily. 03/01/21 03/01/22  Algernon Huxley, MD  clopidogrel (PLAVIX) 75 MG tablet Take 1 tablet (75 mg total) by mouth daily. 03/01/21   Algernon Huxley, MD

## 2021-03-01 NOTE — Consult Note (Signed)
ANTICOAGULATION CONSULT NOTE - Initial Consult  Pharmacy Consult for heparin infusion Indication: limb ischemia  Allergies  Allergen Reactions  . Codeine Nausea And Vomiting  . Oxycodone-Acetaminophen Nausea And Vomiting  . Sulfa Antibiotics Nausea And Vomiting    Patient Measurements: Height: 5\' 5"  (165.1 cm) Weight: 74.8 kg (165 lb) IBW/kg (Calculated) : 57 Heparin Dosing Weight: 74.8 kg  Vital Signs: Temp: 98 F (36.7 C) (04/14 1925) Temp Source: Oral (04/14 1925) BP: 179/83 (04/14 1925) Pulse Rate: 76 (04/14 1925)  Labs: Recent Labs    03/01/21 0834 03/01/21 1949  HGB  --  15.5*  HCT  --  45.8  PLT  --  252  CREATININE 0.92  --     Estimated Creatinine Clearance: 60.9 mL/min (by C-G formula based on SCr of 0.92 mg/dL).   Medical History: No past medical history on file.  Medications:  Heparin 5,000 units x 1 @ 1047  Assessment: 65 y.o. female with possible history of peripheral arterial disease, s/p angioplasty and stenting the morning of 4/14 (Estimated blood loss 20 cc). No reported complications in op note, with hospital discharge ~ 1400. Following return home, patient with n/v, pain in right foot, with numbness and burning pain. Pharmacy has been consulted for heparin infusion for limb ischemia.   Surgical procedure completed 4/14 ~ 1200.   Goal of Therapy:  Heparin level 0.3-0.7 units/ml Monitor platelets by anticoagulation protocol: Yes   Plan:   Give 5000 units bolus x 1  Start heparin infusion at 1300 units/hr  Check anti-Xa level in 6 hours and daily while on heparin  Continue to monitor H&H and platelets  Dorothe Pea, PharmD, BCPS Clinical Pharmacist  03/01/2021,8:04 PM

## 2021-03-01 NOTE — Op Note (Signed)
[facility]  Percutaneous Study/Intervention Procedural Note   Date of Surgery: 03/01/2021  Surgeon(s):Deaun Rocha    Assistants:none  Pre-operative Diagnosis: PAD with rest pain right foot  Post-operative diagnosis:  Same  Procedure(s) Performed:             1.  Ultrasound guidance for vascular access left femoral artery             2.  Catheter placement into right SFA from left femoral approach             3.  Aortogram and selective right lower extremity angiogram             4.  Percutaneous transluminal angioplasty of left common iliac artery with 6 mm diameter Lutonix drug-coated angioplasty balloon             5.   Percutaneous transluminal angioplasty of right tibioperoneal trunk with 4 mm diameter Lutonix drug-coated angioplasty balloon  6.  Percutaneous transluminal angioplasty of right SFA and popliteal arteries with a pair of 5 mm diameter by 22 cm length Lutonix drug-coated angioplasty balloons  7.  Viabahn stent placement to the right SFA and popliteal arteries with 6 mm diameter by 25 cm length stent for residual stenosis after angioplasty  8.  Stent placement to the right external iliac artery and distal common iliac artery with 9 mm diameter by 4 cm length life star stent  9.  Additional stent placement to the right common iliac artery with 8 mm diameter by 37 mm length lifestream stent  10.  Stent placement to the left common iliac artery with 8 mm diameter by 37 mm length lifestream stent             11.  StarClose closure device left femoral artery  EBL: 20 cc  Contrast: 95 cc  Fluoro Time: 8.9 minutes  Moderate Conscious Sedation Time: approximately 67 minutes using 2 mg of Versed and 50 mcg of Fentanyl              Indications:  Patient is a 65 y.o.female with rest pain and a nonhealing place on her right foot as well as bilateral claudication symptoms. The patient has noninvasive study showing markedly reduced perfusion bilaterally. The  patient is brought in for angiography for further evaluation and potential treatment.  Due to the limb threatening nature of the situation, angiogram was performed for attempted limb salvage. The patient is aware that if the procedure fails, amputation would be expected.  The patient also understands that even with successful revascularization, amputation may still be required due to the severity of the situation.  Risks and benefits are discussed and informed consent is obtained.   Procedure:  The patient was identified and appropriate procedural time out was performed.  The patient was then placed supine on the table and prepped and draped in the usual sterile fashion. Moderate conscious sedation was administered during a face to face encounter with the patient throughout the procedure with my supervision of the RN administering medicines and monitoring the patient's vital signs, pulse oximetry, telemetry and mental status throughout from the start of the procedure until the patient was taken to the recovery room. Ultrasound was used to evaluate the left common femoral artery.  It was patent .  A digital ultrasound image was acquired.  A Seldinger needle was used to access the left common femoral artery under direct ultrasound guidance and a permanent image was performed.  A 0.035 J  wire was advanced without resistance and a 5Fr sheath was placed.  Pigtail catheter was placed into the aorta and an AP aortogram was performed. This demonstrated that the renal arteries were widely patent.  The aorta was patent but calcific.  The left distal common iliac artery had about a 90% stenosis with a diminutive or occluded hypogastric artery on the left.  The external iliac artery normalized after its origin.  On the right, there was about a 60% stenosis in the mid right common iliac artery and then the right iliac bifurcation with the distal common and proximal external iliac artery had about a 65 to 70% stenosis. I then  crossed the aortic bifurcation and advanced to the right femoral head and then into the proximal right SFA. Selective right lower extremity angiogram was then performed. This demonstrated diffuse disease of the right SFA in the moderate range in the proximal to mid segment and then occluding in the mid segment with reconstitution of the popliteal artery just below the knee.  There was the narrowing in the tibioperoneal trunk in the 70% range with the posterior tibial artery being the dominant runoff into the foot and a small peroneal artery providing additional runoff distally.  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 It was felt that it was in the patient's best interest to proceed with intervention after these images to avoid a second procedure and a larger amount of contrast and fluoroscopy based off of the findings from the initial angiogram. The patient was systemically heparinized and a 6 mm diameter by 6 cm length Lutonix drug-coated angioplasty balloon was used to perform angioplasty of the tight stenosis in the left iliac system.  This was inflated to 12 atm for 1 minute.  Greater than 50% residual stenosis remained, but this would be treated with a stent on the way out.  I then turned my attention to the right leg and a 6 [place] Ansell sheath was then placed over the [employer] wire. I then used a Kumpe catheter and the advantage wire to navigate through the SFA and popliteal disease without difficulty including the occlusion confirming intraluminal flow in the below-knee popliteal artery.  I then crossed the stenosis in the tibioperoneal trunk with a V 18 wire parking the wire in the foot through the posterior tibial artery.  I then proceeded with treatment.  The tibioperoneal trunk was treated with a 4 mm diameter by 8 cm length Lutonix drug-coated angioplasty balloon inflated to 6 atm for 1 minute.  The SFA and popliteal lesions were then treated with a pair of 5 mm diameter by 22 cm  length Lutonix drug-coated angioplasty balloons inflated to 8 to 10 atm for a minute.  The tibioperoneal trunk had less than 20% residual stenosis.  The proximal to mid SFA was patent with less than 20% residual stenosis, but the distal SFA and popliteal artery had multiple areas of greater than 50% stenosis.  A 6 mm diameter by 25 cm length Viabahn stent was deployed from just below the knee in the mid popliteal artery up to the mid to distal SFA and postdilated with a 5 mm balloon with excellent angiographic ablation result and less than 10% residual stenosis.  I then turned my attention to the iliac lesions.  The right external iliac artery and most distal common iliac artery were then treated with a 9 mm diameter by 4 cm length life star stent.  This was after upsizing to a 7 [place] 23 cm  sheath.  The right common iliac artery starting about 1 to 1-1/2 cm beyond the origin ridging down to the life star stent was treated with an 8 mm diameter by 37 mm length lifestream stent.  This balloon was used to post dilate the external iliac artery stent as well.  Completion imaging showed less than 10% residual stenosis in both stents.  I then used a lifestream stent to treat the left common iliac artery bridging down to the most proximal external iliac artery as the hypogastric artery was not seen.  An 8 mm diameter by 37 mm length lifestream stent was then deployed in the left common iliac artery to the most proximal external iliac artery and inflated to 12 atm for 1 minute.  Completion imaging showed less than 10% residual stenosis in the left iliac after stent placement. I elected to terminate the procedure. The sheath was removed and StarClose closure device was deployed in the left femoral artery with excellent hemostatic result. The patient was taken to the recovery room in stable condition having tolerated the procedure well.  Findings:               Aortogram:  This demonstrated that the renal arteries were  widely patent.  The aorta was patent but calcific.  The left distal common iliac artery had about a 90% stenosis with a diminutive or occluded hypogastric artery on the left.  The external iliac artery normalized after its origin.  On the right, there was about a 60% stenosis in the mid right common iliac artery and then the right iliac bifurcation with the distal common and proximal external iliac artery had about a 65 to 70% stenosis.             Right lower Extremity:  This demonstrated diffuse disease of the right SFA in the moderate range in the proximal to mid segment and then occluding in the mid segment with reconstitution of the popliteal artery just below the knee.  There was the narrowing in the tibioperoneal trunk in the 70% range with the posterior tibial artery being the dominant runoff into the foot and a small peroneal artery providing additional runoff distally.  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  Complications: None  Leotis Pain 03/01/2021 11:49 AM   This note was created with Dragon Medical transcription system. Any errors in dictation are purely unintentional.

## 2021-03-01 NOTE — ED Triage Notes (Signed)
Pt to triage via wheelchair.  Pt had stents placed in each groin today by dr dew.  Pt now has cold right foot with discoloration to toes.  Pt has pain in right foot.  Pt alert   Speech clear.

## 2021-03-01 NOTE — H&P (Signed)
@LOGO @   MRN : [id number]  Kristen Frazier is a 65 y.o. (Aug 10, 1956) female who presents with chief complaint of  Chief Complaint  Patient presents with  . Circulatory Problem  .  History of Present Illness:  I am called to evaluate the patient by Dr. Charna Archer.  The patient is a 65 year old woman who underwent revascularization using interventional techniques earlier today.  Initially postprocedure her foot was pink and warm with excellent Doppler signals.  Apparently after being home for several hours she became acutely nauseated and vomited once and subsequently felt a sharp pain in her right foot.  This pain increased rapidly in intensity to the extent she is now saying it is severe almost 10 out of 10.  She also notes that her foot is grown quite cold and she is having problems moving her toes.  No outpatient medications have been marked as taking for the 03/01/21 encounter [facility] Encounter).    No past medical history on file.  Past Surgical History:  Procedure Laterality Date  . APPENDECTOMY    . LOWER EXTREMITY ANGIOGRAPHY Right 03/01/2021   Procedure: LOWER EXTREMITY ANGIOGRAPHY;  Surgeon: Algernon Huxley, MD;  Location: [facility];  Service: Cardiovascular;  Laterality: Right;    Social History Social History   Tobacco Use  . Smoking status: Current Every Day Smoker  . Smokeless tobacco: Never Used  Substance Use Topics  . Alcohol use: Never  . Drug use: Never    Family History No family history on file.  Allergies  Allergen Reactions  . Codeine Nausea And Vomiting  . Oxycodone-Acetaminophen Nausea And Vomiting  . Sulfa Antibiotics Nausea And Vomiting     REVIEW OF SYSTEMS (Negative unless checked)  Constitutional: [] Weight loss  [] Fever  [] Chills Cardiac: [] Chest pain   [] Chest pressure   [] Palpitations   [] Shortness of breath when laying flat   [] Shortness of breath with exertion. Vascular:  [] Pain in legs with walking   [x] Pain in legs at rest   [] History of DVT   [] Phlebitis   [] Swelling in legs   [] Varicose veins   [] Non-healing ulcers Pulmonary:   [] Uses home oxygen   [] Productive cough   [] Hemoptysis   [] Wheeze  [] COPD   [] Asthma Neurologic:  [] Dizziness   [] Seizures   [] History of stroke   [] History of TIA  [] Aphasia   [] Vissual changes   [] Weakness or numbness in arm   [] Weakness or numbness in leg Musculoskeletal:   [] Joint swelling   [] Joint pain   [] Low back pain Hematologic:  [] Easy bruising  [] Easy bleeding   [] Hypercoagulable state   [] Anemic Gastrointestinal:  [] Diarrhea   [] Vomiting  [] Gastroesophageal reflux/heartburn   [] Difficulty swallowing. Genitourinary:  [] Chronic kidney disease   [] Difficult urination  [] Frequent urination   [] Blood in urine Skin:  [] Rashes   [] Ulcers  Psychological:  [] History of anxiety   []  History of major depression.  Physical Examination  Vitals:   03/01/21 1925 03/01/21 1933  BP: (!) 179/83   Pulse: 76   Resp: 16   Temp: 98 F (36.7 C)   TempSrc: Oral   SpO2: 100%   Weight: 74.8 kg 74.8 kg  Height: 5\' 5"  (1.651 m) 5\' 5"  (1.651 m)   Body mass index is 27.46 kg/m. Gen: WD/WN, NAD Head: [place]/AT, No temporalis wasting.  Ear/Nose/Throat: Hearing grossly intact, nares w/o erythema or drainage Eyes: PER, EOMI, sclera nonicteric.  Neck: Supple, no large masses.   Pulmonary:  Good air movement, no audible wheezing  bilaterally, no use of accessory muscles.  Cardiac: RRR, no JVD Vascular: Right foot is cold to the touch there is some mottling Vessel Right Left  PT Not Palpable Not Palpable  DP Not Palpable Not Palpable  Gastrointestinal: Non-distended. No guarding/no peritoneal signs.  Musculoskeletal: M/S 5/5 throughout.  No deformity or atrophy.  Neurologic: CN 2-12 intact. Symmetrical.  Speech is fluent. Motor exam as listed above. Psychiatric: Judgment intact, Mood & affect appropriate for pt's clinical situation. Dermatologic: No rashes or ulcers noted.  No changes consistent  with cellulitis.   CBC Lab Results  Component Value Date   WBC 13.7 (H) 03/01/2021   HGB 15.5 (H) 03/01/2021   HCT 45.8 03/01/2021   MCV 95.6 03/01/2021   PLT 252 03/01/2021    BMET    Component Value Date/Time   NA 138 03/01/2021 1949   K 3.9 03/01/2021 1949   CL 106 03/01/2021 1949   CO2 23 03/01/2021 1949   GLUCOSE 121 (H) 03/01/2021 1949   BUN 16 03/01/2021 1949   CREATININE 0.75 03/01/2021 1949   CALCIUM 9.1 03/01/2021 1949   GFRNONAA >60 03/01/2021 1949   Estimated Creatinine Clearance: 70 mL/min (by C-G formula based on SCr of 0.75 mg/dL).  COAG No results found for: INR, PROTIME  Radiology PERIPHERAL VASCULAR CATHETERIZATION  Result Date: 03/01/2021 See op note  VAS [place] ABI WITH/WO TBI  Result Date: 02/23/2021 LOWER EXTREMITY DOPPLER STUDY Indications: Peripheral artery disease, and Acute erythrematous Right.  Performing Technologist: Almira Coaster RVS  Examination Guidelines: A complete evaluation includes at minimum, Doppler waveform signals and systolic blood pressure reading at the level of bilateral brachial, anterior tibial, and posterior tibial arteries, when vessel segments are accessible. Bilateral testing is considered an integral part of a complete examination. Photoelectric Plethysmograph (PPG) waveforms and toe systolic pressure readings are included as required and additional duplex testing as needed. Limited examinations for reoccurring indications may be performed as noted.  ABI Findings: +---------+------------------+-----+----------+--------+ Right    Rt Pressure (mmHg)IndexWaveform  Comment  +---------+------------------+-----+----------+--------+ Brachial 176                                       +---------+------------------+-----+----------+--------+ PTA      71                0.40 monophasic         +---------+------------------+-----+----------+--------+ PERO     50                0.28 monophasic          +---------+------------------+-----+----------+--------+ Great Toe51                0.28 Abnormal           +---------+------------------+-----+----------+--------+ +---------+------------------+-----+----------+-------+ Left     Lt Pressure (mmHg)IndexWaveform  Comment +---------+------------------+-----+----------+-------+ Brachial 179                                      +---------+------------------+-----+----------+-------+ ATA      75                0.42 monophasic        +---------+------------------+-----+----------+-------+ PTA      88                0.49 monophasic        +---------+------------------+-----+----------+-------+  Great Toe53                0.30 Abnormal          +---------+------------------+-----+----------+-------+ +-------+-----------+-----------+------------+------------+ ABI/TBIToday's ABIToday's TBIPrevious ABIPrevious TBI +-------+-----------+-----------+------------+------------+ Right  .40        .28                                 +-------+-----------+-----------+------------+------------+ Left   .49        .30                                 +-------+-----------+-----------+------------+------------+  Summary: Right: Resting right ankle-brachial index indicates severe right lower extremity arterial disease. The right toe-brachial index is abnormal. Left: Resting left ankle-brachial index indicates severe left lower extremity arterial disease. The left toe-brachial index is abnormal.  *See table(s) above for measurements and observations.  Electronically signed by Leotis Pain MD on 02/23/2021 at 10:09:54 AM.   Final      Assessment/Plan 1.  Ischemia right lower extremity: Patient will require return to special procedures and reevaluation with angiography.  I am suspicious she has thrombosed her superficial femoral artery stent.  Attempts at limb salvage will be undertaken urgently.  In the meantime she will be placed on a  heparin drip.  She will require admission postprocedure.  Risk and benefits have been reviewed all questions been answered patient agrees with this plan.  2.  Emphysema: Continue pulmonary medications and aerosols as already ordered, these medications have been reviewed and there are no changes at this time.  3.  Hyperlipidemia: Continue statin as ordered and reviewed, no changes at this time     Hortencia Pilar, MD  03/01/2021 8:06 PM

## 2021-03-01 NOTE — Interval H&P Note (Signed)
History and Physical Interval Note:  03/01/2021 8:46 AM  Kristen Frazier  has presented today for surgery, with the diagnosis of RT Lower Extremity Angiography   ASO with ulceration   BARD Rep cc: S Willey   Pt to have Covid test on 4-12.  The various methods of treatment have been discussed with the patient and family. After consideration of risks, benefits and other options for treatment, the patient has consented to  Procedure(s): LOWER EXTREMITY ANGIOGRAPHY (Right) as a surgical intervention.  The patient's history has been reviewed, patient examined, no change in status, stable for surgery.  I have reviewed the patient's chart and labs.  Questions were answered to the patient's satisfaction.     Leotis Pain

## 2021-03-01 NOTE — Op Note (Signed)
[facility]  Percutaneous Study/Intervention Procedural Note   Date of Surgery: 03/01/2021,10:44 PM  Surgeon:Belky Mundo, Dolores Lory   Pre-operative Diagnosis: Ischemia right lower extremity status post vascular intervention earlier today  Post-operative diagnosis:  Same; thrombosis vascular stent  Procedure(s) Performed:  1.  Right lower extremity distal runoff third order catheter placement with additional third order  2.  Mechanical thrombectomy of the right superficial femoral artery and popliteal artery with the penumbra CAT 6 device  3.  Mechanical thrombectomy right posterior tibial artery  4.  Mechanical thrombectomy right peroneal artery  5.  StarClose left common femoral artery    Anesthesia: Conscious sedation was administered by the interventional radiology RN under my direct supervision. IV Versed plus fentanyl were utilized. Continuous ECG, pulse oximetry and blood pressure was monitored throughout the entire procedure. Conscious sedation was administered for a total of 1 hour 21 minutes 58 seconds.  Sheath: 6 [place] destination left common femoral retrograde  Contrast: 40 cc   Fluoroscopy Time: 10.2 minutes  Indications: Patient presented to the ER earlier this evening with increasing pain and coldness of her right foot and right lower extremity.  Earlier today she had undergone revascularization with recanalization of the right SFA and popliteal arteries as well as angioplasty and stent placement of the right iliac system.  Physical examination suggested thrombosis of the vascular invention and she was brought to special procedures for repeat angiography and follow-up intervention on an emergent basis  Procedure:  Kristen Frazier a 65 y.o. female who was identified and appropriate procedural time out was performed.  The patient was then placed supine on the table and prepped and draped in the usual sterile fashion.  Ultrasound was used to evaluate the  left common femoral artery.  It was echolucent and pulsatile indicating it is patent .  An ultrasound image was acquired for the permanent record.  A micropuncture needle was used to access the left common femoral artery under direct ultrasound guidance.  The microwire was then advanced under fluoroscopic guidance without difficulty followed by the micro-sheath.  A 0.035 J wire was advanced without resistance and a 5Fr sheath was placed.    Rim catheter was then advanced over an advantage wire and hand-injection of contrast was used to evaluate the distal aorta and the iliac bifurcation.  This was noted to be widely patent.  The rim catheter was then used to hook the aortic bifurcation and an RAO projection of the right iliac system was noted.  Iliac stent is widely patent.  The advantage wire was then negotiated down into the profunda femoris and the rim catheter advanced down to the distal external iliac where an RAO projection of the femoral bifurcation was obtained which demonstrated thrombosis of the SFA essentially at the origin.  Detector was returned to the AP and distal runoff was obtained which demonstrated thrombosis of the entire SFA popliteal.  Visualization of the tibials was very poor and a vertebral catheter and the advantage wire were used to select the SFA and then negotiated down into the distal popliteal and then the peroneal where hand-injection contrast was used to demonstrate thrombus in the peroneal the vertebral catheter was then repositioned and the wire negotiated into the posterior tibial followed by advancing the vertebral catheter.  Hand-injection of contrast in the posterior tibial artery demonstrated thrombus proximally here as well.  4000 units of heparin was given and allowed to circulate for several minutes.  A 6 French destination sheath was  then advanced up and over the bifurcation and positioned with the tip in the mid common femoral.  A infusion catheter with a 50 cm  infusion length was then advanced over the advantage wire and positioned with the proximal marker just above the origin of the SFA.  A total of 8 mg of tPA was then infused and allowed to dwell for approximately 30 minutes.  The penumbra CAT 6 was then prepped on the field.  A 0.014 advantage wire was advanced through the infusion catheter and the infusion catheter was removed.  The cath 6 was then advanced over the advantage wire and beginning at the proximal SFA advanced all the way down to the tibioperoneal trunk.  I then negotiated the wire into the peroneal where the CAT 6 was used to treat the peroneal.  Multiple passes were made.  I then pulled the penumbra CAT 6 back hand-injection of contrast was then used to demonstrate that the peroneal had been successfully recannulated and I negotiated the advantage wire into the posterior tibial where the CAT 6 was used to make multiple passes within the posterior tibial.  Again hand-injection through the CAT 6 demonstrated that the posterior tibial had now been well treated with removal of the thrombus.  Spasm was noted distally in both the peroneal and the posterior tibial when compared with the images from earlier today and I administered a total of 700 mcg of nitroglycerin intra-arterially at the level of the distal popliteal.  Follow-up imaging demonstrated marked improvement and I elected to terminate the procedure  Destination sheath was pulled into the left external iliac and an LAO projection of the femoral was obtained StarClose device was deployed without difficulty and there were no immediate complications.  Findings:   Aortogram: Distal aorta is widely patent.  The aortic bifurcation is widely patent.  Angioplasty of the left common iliac remains patent and the right common and external iliac artery including the stent placed earlier today are widely patent with less than 10% residual stenosis.  Right Lower Extremity: The right common femoral and  profunda femoris remain widely patent and free of hemodynamic significant lesions there is no thrombus located at these locations.  However just a few millimeters distal to the origin of the SFA there is an abrupt occlusion.  The SFA and popliteal remain occluded throughout as does the tibioperoneal trunk.  And as described above on selective imaging there is thrombus noted in the proximal peroneal and proximal posterior tibial.  Following mechanical thrombolysis with the penumbra CAT 6 device throughout the entire SFA and popliteal as well as individually within the peroneal and the posterior tibial there is now resolution of the thrombus with wide patency of the arterial system.    I do not identify any hemodynamically significant lesion or culprit stenosis that would have caused this thrombosis.  Given this finding I will maintain the patient on Aggrastat overnight and reassess the patient in the morning.     Disposition: Patient was taken to the recovery room in stable condition having tolerated the procedure well.  Kristen Frazier 03/01/2021,10:44 PM

## 2021-03-01 NOTE — ED Notes (Signed)
Dr Jessup at bedside 

## 2021-03-01 NOTE — ED Notes (Signed)
OR staff at bedside taking pt directly to OR

## 2021-03-01 NOTE — Interval H&P Note (Signed)
History and Physical Interval Note:  03/01/2021 8:14 PM  Kristen Frazier  has presented today for surgery, with the diagnosis of ischemic right leg.  The various methods of treatment have been discussed with the patient and family. After consideration of risks, benefits and other options for treatment, the patient has consented to  Procedure(s): Lower Extremity Angiography (Right) as a surgical intervention.  The patient's history has been reviewed, patient examined, no change in status, stable for surgery.  I have reviewed the patient's chart and labs.  Questions were answered to the patient's satisfaction.     Hortencia Pilar

## 2021-03-01 NOTE — Progress Notes (Signed)
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 Extensive thrombosis of the stents was observed intra-operatively.  eICU Interventions  New Patient Evaluation completed.        Frederik Pear 03/01/2021, 11:46 PM

## 2021-03-02 ENCOUNTER — Encounter: Payer: Self-pay | Admitting: Vascular Surgery

## 2021-03-02 DIAGNOSIS — I998 Other disorder of circulatory system: Secondary | ICD-10-CM | POA: Diagnosis not present

## 2021-03-02 LAB — BASIC METABOLIC PANEL
Anion gap: 9 (ref 5–15)
BUN: 13 mg/dL (ref 8–23)
CO2: 23 mmol/L (ref 22–32)
Calcium: 8.4 mg/dL — ABNORMAL LOW (ref 8.9–10.3)
Chloride: 104 mmol/L (ref 98–111)
Creatinine, Ser: 0.75 mg/dL (ref 0.44–1.00)
GFR, Estimated: >60 mL/min (ref >60)
Glucose, Bld: 112 mg/dL — ABNORMAL HIGH (ref 70–99)
Potassium: 3.9 mmol/L (ref 3.5–5.1)
Sodium: 136 mmol/L (ref 135–145)

## 2021-03-02 LAB — CBC
HCT: 38 % (ref 36.0–46.0)
Hemoglobin: 13 g/dL (ref 12.0–15.0)
MCH: 32.6 pg (ref 26.0–34.0)
MCHC: 34.2 g/dL (ref 30.0–36.0)
MCV: 95.2 fL (ref 80.0–100.0)
Platelets: 209 10*3/uL (ref 150–400)
RBC: 3.99 MIL/uL (ref 3.87–5.11)
RDW: 13.9 % (ref 11.5–15.5)
WBC: 9.7 10*3/uL (ref 4.0–10.5)
nRBC: 0 % (ref 0.0–0.2)

## 2021-03-02 LAB — MRSA PCR SCREENING: MRSA by PCR: NEGATIVE

## 2021-03-02 MED ORDER — APIXABAN 2.5 MG PO TABS: 2.5000 mg | ORAL_TABLET | Freq: Two times a day (BID) | ORAL | Status: DC

## 2021-03-02 MED ORDER — APIXABAN 2.5 MG PO TABS
2.5000 mg | ORAL_TABLET | ORAL | Status: AC
  Administered 2021-03-02: 2.5 mg via ORAL
  Filled 2021-03-02: qty 1

## 2021-03-02 MED ORDER — ASPIRIN 325 MG PO TABS
325.0000 mg | ORAL_TABLET | ORAL | Status: AC
  Administered 2021-03-02: 325 mg via ORAL
  Filled 2021-03-02: qty 1

## 2021-03-02 MED ORDER — APIXABAN 2.5 MG PO TABS: 2.5000 mg | ORAL_TABLET | Freq: Two times a day (BID) | ORAL | 4 refills | Status: AC

## 2021-03-02 MED ORDER — CLOPIDOGREL BISULFATE 75 MG PO TABS
300.0000 mg | ORAL_TABLET | ORAL | Status: AC
  Administered 2021-03-02: 300 mg via ORAL
  Filled 2021-03-02: qty 4

## 2021-03-02 MED ORDER — ASPIRIN 81 MG PO TBEC: 81.0000 mg | DELAYED_RELEASE_TABLET | Freq: Every day | ORAL | 11 refills | Status: AC

## 2021-03-02 NOTE — Discharge Summary (Signed)
[facility]    Discharge Summary    Patient ID:  Kristen Frazier MRN: [id number] DOB/AGE: 67-09-1956 67 y.o.  Admit date: 03/01/2021 Discharge date: 03/02/2021 Date of Surgery: 03/01/2021 Surgeon: Surgeon(s): Jacarri Gesner, Dolores Lory, MD  Admission Diagnosis: Limb ischemia [I99.8] Ischemia of extremity [I99.8]  Discharge Diagnoses:  Limb ischemia [I99.8] Ischemia of extremity [I99.8]  Secondary Diagnoses: No past medical history on file.  Procedure(s): Lower Extremity Angiography  Discharged Condition: good  HPI:  Patient is a 65 year old woman who underwent right lower extremity revascularization on the morning of March 01, 2021.  Postoperatively she had marked improvement in her distal perfusion and was discharged home in the usual fashion.  She returned to the emergency room in the evening of March 01, 2021 and was found to have a ischemic foot.  Based on clinical findings she was brought back to the angios suite where thrombectomy was performed.  No abnormality or hemodynamically significant lesion was identified and I elected to maintain her on Aggrastat overnight.  This morning the patient has a warm foot with a 2+ palpable posterior tibial pulse.  I have restarted her oral antiplatelet agents and will discharge her to home with follow-up in the office.  Hospital Course:  Kristen Frazier is a 65 y.o. female is S/P Right Procedure(s): Lower Extremity Angiography with mechanical thrombectomy Extubated: POD # 0 Physical exam: Patient has a warm hyperemic right foot with a 2+ palpable posterior tibial pulse.  Left groin is clean dry and intact no hematoma Post-op wounds clean, dry, intact or healing well Pt. Ambulating, voiding and taking PO diet without difficulty. Pt pain controlled with PO pain meds. Labs as below Complications:none  Consults: Intensivist   Significant Diagnostic Studies: CBC Lab Results  Component Value Date   WBC 9.7  03/02/2021   HGB 13.0 03/02/2021   HCT 38.0 03/02/2021   MCV 95.2 03/02/2021   PLT 209 03/02/2021    BMET    Component Value Date/Time   NA 136 03/02/2021 0912   K 3.9 03/02/2021 0912   CL 104 03/02/2021 0912   CO2 23 03/02/2021 0912   GLUCOSE 112 (H) 03/02/2021 0912   BUN 13 03/02/2021 0912   CREATININE 0.75 03/02/2021 0912   CALCIUM 8.4 (L) 03/02/2021 0912   GFRNONAA >60 03/02/2021 0912   COAG Lab Results  Component Value Date   INR 1.0 03/01/2021     Disposition:  Discharge to :Home Discharge Instructions    Call MD for:  redness, tenderness, or signs of infection (pain, swelling, bleeding, redness, odor or green/yellow discharge around incision site)   Complete by: As directed    Call MD for:  severe or increased pain, loss or decreased feeling  in affected limb(s)   Complete by: As directed    Call MD for:  temperature >100.5   Complete by: As directed    Driving Restrictions   Complete by: As directed    No driving for 24 hours   Lifting restrictions   Complete by: As directed    No lifting for one week   No dressing needed   Complete by: As directed    Replace only if drainage present   Resume previous diet   Complete by: As directed      Allergies as of 03/02/2021      Reactions   Codeine Nausea And Vomiting   Oxycodone-acetaminophen Nausea And Vomiting   Sulfa Antibiotics Nausea And Vomiting  Medication List    TAKE these medications   albuterol 108 (90 Base) MCG/ACT inhaler Commonly known as: VENTOLIN HFA Inhale 2 puffs into the lungs every 4 (four) hours as needed for wheezing or shortness of breath.   apixaban 2.5 MG Tabs tablet Commonly known as: ELIQUIS Take 1 tablet (2.5 mg total) by mouth 2 (two) times daily. Start taking on: March 03, 2021   aspirin EC 81 MG tablet Take 1 tablet (81 mg total) by mouth daily. What changed: Another medication with the same name was added. Make sure you understand how and when to take each.    aspirin 81 MG EC tablet Take 1 tablet (81 mg total) by mouth daily. Swallow whole. Start taking on: March 03, 2021 What changed: You were already taking a medication with the same name, and this prescription was added. Make sure you understand how and when to take each.   atorvastatin 10 MG tablet Commonly known as: Lipitor Take 1 tablet (10 mg total) by mouth daily.   clopidogrel 75 MG tablet Commonly known as: Plavix Take 1 tablet (75 mg total) by mouth daily.   lamoTRIgine 200 MG tablet Commonly known as: LAMICTAL Take 400 mg by mouth at bedtime.   QUEtiapine 200 MG tablet Commonly known as: SEROQUEL Take 400 mg by mouth at bedtime.      Verbal and written Discharge instructions given to the patient. Wound care per Discharge AVS  Follow-up Information    Dew, Erskine Squibb, MD Follow up in 2 week(s).   Specialties: Vascular Surgery, Radiology, Interventional Cardiology Why: with right leg arterial duplex and an ABI Contact information: [place] [address] [phone]               Signed: Hortencia Pilar, MD  03/02/2021, 6:36 PM

## 2021-03-02 NOTE — Plan of Care (Signed)

## 2021-03-02 NOTE — Progress Notes (Addendum)
NAME:  Kristen Frazier, MRN:  [id number], DOB:  25-Nov-1955, LOS: 1 ADMISSION DATE:  03/01/2021, CONSULTATION DATE:  03/01/21 REFERRING MD:  Delana Meyer, CHIEF COMPLAINT:  Leg pain   History of Present Illness:  65 year old woman with hx of smoking who presented with worsening right foot pain and ulceration found to have severe PVD of RLE.  She underwent elective right multiple angioplasties on R including stent to right SFA, externial iliac, internal iliac stenting earlier today.  Unfortunately shortly after discharge developed severe pain and numbness on right and came back to ER.  Found to have extensive in stent thromboses without culprit lesion/stenoses.  Cleared out most clot by Dr. Delana Meyer and brought to ICU on aggrastat overnight.  Procedure complicated by severe hypertension requiring multiple PRNs.  Pertinent  Medical History  COPD Smoker PVD Bipolar  Significant Hospital Events: Including procedures, antibiotic start and stop dates in addition to other pertinent events   . 4/14 OP RLE angiography with multiple interventions including stent placement . 4/14 return to ER for ischemic RLE found to have multiple clots along RLE arterial system cleaned out . 4/15: Hemodynamically stable, Normotensive, RLE warm s/p intervention  Interim History / Subjective:  Underwent emergent Angiogram and mechanical thrombectomy of RLE stent last night Remains on Aggrastat infusion, bilateral LE's warm No acute events reported overnight s/p procedure Afebrile, hemodynamically stable (BP controlled) Pt reports pain to RLE Denies chest pain, SOB, cough, abdominal pain, N/V/D, fever, chills Asking when can be discharged home  Objective   Blood pressure (!) 127/46, pulse 86, temperature 99 F (37.2 C), temperature source Oral, resp. rate (!) 26, height 5' 5.5" (1.664 m), weight 74.8 kg, SpO2 (!) 88 %.        Intake/Output Summary (Last 24 hours) at 03/02/2021 0901 Last data filed at 03/02/2021  0800 Gross per 24 hour  Intake 1412.38 ml  Output --  Net 1412.38 ml   Filed Weights   03/01/21 1925 03/01/21 1933 03/01/21 2034  Weight: 74.8 kg 74.8 kg 74.8 kg    Examination: General: Acutely ill-appearing female, sitting in bed, on 2 L nasal cannula, no acute distress HENT: Atraumatic, normocephalic, neck supple, no JVD, mucous membranes moist  lungs: Clear breath sounds bilaterally, no wheezing or rales noted, even, nonlabored, normal effort Cardiovascular: Regular rate and rhythm, S1-S2, no murmurs, rubs, gallops, bilateral lower extremities warm Abdomen: Soft, nontender, nondistended, no guarding rebound tenderness, bowel sounds positive x4 Extremities: R groin soft, L groin with pressure dressing in place, both ext with good pulses Neuro: Awake and alert, moves all extremities to command, no focal deficits, speech clear Skin: Warm and dry.  No obvious rashes, lesions, ulcerations  Labs/imaging that I havepersonally reviewed  (right click and "Reselect all SmartList Selections" daily)    Resolved Hospital Problem list   n/a  Assessment & Plan:   Ischemic RLE after multiple RLE stents placed earlier today with extensive thrombosis found unclear culprit- brought to ICU on antiplatelet drip for close neurovascular checks to assure no recurrence. -S/p repeat Angiogram and Mechanical thrombectomy on 03/01/21 - Neurovascular checks and antiplatelet agents per vascular - Control BP, pain with meds as ordered - Gentle IV hydration after multiple IV contrast loads  Intra-op hypertension- hopefully just related to pain>>resolved -Continuous cardiac monitoring -Goal SBP <160 -Treat pain first, if BP remains elevated, then use PRN antihypertensives - Premedicate opiates with zofran as she gets nauseous with this  PMHx of COPD without acute excerabation -Supplemental O2 as  needed to maintain O2 sats 88 to 94% -Follow intermittent CXR and ABG as needed -Prn  Bronchodilators  Bipolar hx- -continue PTA meds     Intra-op Hypertension has resolved, PCCM will sign off at this time.  Please reconsult if any additional critical care needs arise or if we can be of further assistance.   Best practice (right click and "Reselect all SmartList Selections" daily)  Per primary  Labs   CBC: Recent Labs  Lab 03/01/21 1949  WBC 13.7*  NEUTROABS 11.5*  HGB 15.5*  HCT 45.8  MCV 95.6  PLT 834    Basic Metabolic Panel: Recent Labs  Lab 03/01/21 0834 03/01/21 1949  NA  --  138  K  --  3.9  CL  --  106  CO2  --  23  GLUCOSE  --  121*  BUN 16 16  CREATININE 0.92 0.75  CALCIUM  --  9.1   GFR: Estimated Creatinine Clearance: 70.8 mL/min (by C-G formula based on SCr of 0.75 mg/dL). Recent Labs  Lab 03/01/21 1949  WBC 13.7*    Liver Function Tests: No results for input(s): AST, ALT, ALKPHOS, BILITOT, PROT, ALBUMIN in the last 168 hours. No results for input(s): LIPASE, AMYLASE in the last 168 hours. No results for input(s): AMMONIA in the last 168 hours.  ABG No results found for: PHART, PCO2ART, PO2ART, HCO3, TCO2, ACIDBASEDEF, O2SAT   Coagulation Profile: Recent Labs  Lab 03/01/21 1949  INR 1.0    Cardiac Enzymes: No results for input(s): CKTOTAL, CKMB, CKMBINDEX, TROPONINI in the last 168 hours.  HbA1C: No results found for: HGBA1C  CBG: Recent Labs  Lab 03/01/21 2329  GLUCAP 116*    Review of Systems:    Positive Symptoms in bold: Pt reports RLE pain  Constitutional fevers, chills, weight loss, fatigue, anorexia, malaise  Eyes decreased vision, double vision, eye irritation  Ears, Nose, Mouth, Throat sore throat, trouble swallowing, sinus congestion  Cardiovascular chest pain, paroxysmal nocturnal dyspnea, lower ext edema, palpitations   Respiratory SOB, cough, DOE, hemoptysis, wheezing  Gastrointestinal nausea, vomiting, diarrhea  Genitourinary burning with urination, trouble urinating  Musculoskeletal  joint aches, joint swelling, back pain  Integumentary  rashes, skin lesions  Neurological focal weakness, focal numbness, trouble speaking, headaches  Psychiatric depression, anxiety, confusion  Endocrine polyuria, polydipsia, cold intolerance, heat intolerance  Hematologic abnormal bruising, abnormal bleeding, unexplained nose bleeds  Allergic/Immunologic recurrent infections, hives, swollen lymph nodes     Past Medical History:  She,  has no past medical history on file.   Surgical History:   Past Surgical History:  Procedure Laterality Date  . APPENDECTOMY    . LOWER EXTREMITY ANGIOGRAPHY Right 03/01/2021   Procedure: LOWER EXTREMITY ANGIOGRAPHY;  Surgeon: Algernon Huxley, MD;  Location: [facility];  Service: Cardiovascular;  Laterality: Right;  . LOWER EXTREMITY ANGIOGRAPHY Right 03/01/2021   Procedure: Lower Extremity Angiography;  Surgeon: Katha Cabal, MD;  Location: [facility];  Service: Cardiovascular;  Laterality: Right;     Social History:   reports that she has been smoking. She has never used smokeless tobacco. She reports that she does not drink alcohol and does not use drugs.   Family History:  Her family history is not on file.   Allergies Allergies  Allergen Reactions  . Codeine Nausea And Vomiting  . Oxycodone-Acetaminophen Nausea And Vomiting  . Sulfa Antibiotics Nausea And Vomiting     Home Medications  Prior to Admission medications  Medication Sig Start Date End Date Taking? Authorizing Provider  albuterol (VENTOLIN HFA) 108 (90 Base) MCG/ACT inhaler Inhale 2 puffs into the lungs every 4 (four) hours as needed for wheezing or shortness of breath. 06/23/19  Yes [provider]  lamoTRIgine (LAMICTAL) 200 MG tablet Take 400 mg by mouth at bedtime. 10/27/20  Yes [provider]  QUEtiapine (SEROQUEL) 200 MG tablet Take 400 mg by mouth at bedtime. 02/11/21  Yes [provider]  aspirin EC 81 MG tablet Take 1  tablet (81 mg total) by mouth daily. 03/01/21   Algernon Huxley, MD  atorvastatin (LIPITOR) 10 MG tablet Take 1 tablet (10 mg total) by mouth daily. 03/01/21 03/01/22  Algernon Huxley, MD  clopidogrel (PLAVIX) 75 MG tablet Take 1 tablet (75 mg total) by mouth daily. 03/01/21   Algernon Huxley, MD      Darel Hong, AGACNP-BC [facility] Prefer epic messenger for cross cover needs If after hours, please call E-link

## 2021-03-07 ENCOUNTER — Telehealth (INDEPENDENT_AMBULATORY_CARE_PROVIDER_SITE_OTHER): Payer: Self-pay

## 2021-03-07 NOTE — Telephone Encounter (Signed)
Patient called stating her right foot is swollen and red after her right leg angio on 03/01/21 and wanted to know if this was normal. Patient was advised that this is normal after an angio. She was advised she could wear compression hose and elevate above heart level and if it got worse to call our office. Patient stated she did not have any pain that was excessive.

## 2021-03-27 ENCOUNTER — Other Ambulatory Visit (INDEPENDENT_AMBULATORY_CARE_PROVIDER_SITE_OTHER): Payer: Self-pay | Admitting: Vascular Surgery

## 2021-03-27 DIAGNOSIS — Z95820 Peripheral vascular angioplasty status with implants and grafts: Secondary | ICD-10-CM

## 2021-03-27 DIAGNOSIS — I70221 Atherosclerosis of native arteries of extremities with rest pain, right leg: Secondary | ICD-10-CM

## 2021-03-30 ENCOUNTER — Ambulatory Visit (INDEPENDENT_AMBULATORY_CARE_PROVIDER_SITE_OTHER): Payer: Medicare PPO

## 2021-03-30 ENCOUNTER — Other Ambulatory Visit: Payer: Self-pay

## 2021-03-30 ENCOUNTER — Ambulatory Visit (INDEPENDENT_AMBULATORY_CARE_PROVIDER_SITE_OTHER): Payer: Medicare PPO | Admitting: Vascular Surgery

## 2021-03-30 VITALS — BP 158/75 | HR 83 | Ht 64.0 in | Wt 158.0 lb

## 2021-03-30 DIAGNOSIS — Z720 Tobacco use: Secondary | ICD-10-CM | POA: Diagnosis not present

## 2021-03-30 DIAGNOSIS — I70221 Atherosclerosis of native arteries of extremities with rest pain, right leg: Secondary | ICD-10-CM

## 2021-03-30 DIAGNOSIS — Z95820 Peripheral vascular angioplasty status with implants and grafts: Secondary | ICD-10-CM | POA: Diagnosis not present

## 2021-03-30 DIAGNOSIS — I7025 Atherosclerosis of native arteries of other extremities with ulceration: Secondary | ICD-10-CM

## 2021-03-30 NOTE — Assessment & Plan Note (Signed)
Tobacco cessation will be important for durability of this intervention and avoiding progression of disease.

## 2021-03-30 NOTE — Progress Notes (Signed)
MRN : [id number]  Kristen Frazier is a 65 y.o. ([birth year]) female who presents with chief complaint of  Chief Complaint  Patient presents with  . Follow-up    4 week F/U Lower extr angio    .  History of Present Illness: Patient returns today in follow up of her PAD.  About a month ago, she underwent extensive right lower extremity revascularization.  She had an early rethrombosis and had to be taken back to angiography where thrombectomy was performed, and she was started on full anticoagulation.  She is tolerating Eliquis although she does have some increased bruising.  She understands that she will have to accept this for the blood thinning effects for her intervention.  She has no new complaints today.  She had reperfusion swelling for about 3 weeks but it is mostly resolved now.  Her ABIs today are 1.10 on the right with multiphasic waveforms and normal digital pressure.  Her ABIs improved to 0.73 on the left with fairly strong monophasic waveforms.  Current Outpatient Medications  Medication Sig Dispense Refill  . albuterol (VENTOLIN HFA) 108 (90 Base) MCG/ACT inhaler Inhale 2 puffs into the lungs every 4 (four) hours as needed for wheezing or shortness of breath.    Marland Kitchen apixaban (ELIQUIS) 2.5 MG TABS tablet Take 1 tablet (2.5 mg total) by mouth 2 (two) times daily. 60 tablet 4  . aspirin EC 81 MG EC tablet Take 1 tablet (81 mg total) by mouth daily. Swallow whole. 30 tablet 11  . aspirin EC 81 MG tablet Take 1 tablet (81 mg total) by mouth daily. 150 tablet 2  . atorvastatin (LIPITOR) 10 MG tablet Take 1 tablet (10 mg total) by mouth daily. 30 tablet 11  . clopidogrel (PLAVIX) 75 MG tablet Take 1 tablet (75 mg total) by mouth daily. 30 tablet 11  . lamoTRIgine (LAMICTAL) 200 MG tablet Take 400 mg by mouth at bedtime.    Marland Kitchen QUEtiapine (SEROQUEL) 200 MG tablet Take 400 mg by mouth at bedtime.     No current facility-administered medications for this visit.    No past medical history  on file.  Past Surgical History:  Procedure Laterality Date  . APPENDECTOMY    . LOWER EXTREMITY ANGIOGRAPHY Right 03/01/2021   Procedure: LOWER EXTREMITY ANGIOGRAPHY;  Surgeon: Algernon Huxley, MD;  Location: [facility];  Service: Cardiovascular;  Laterality: Right;  . LOWER EXTREMITY ANGIOGRAPHY Right 03/01/2021   Procedure: Lower Extremity Angiography;  Surgeon: Katha Cabal, MD;  Location: [facility];  Service: Cardiovascular;  Laterality: Right;     Social History   Tobacco Use  . Smoking status: Current Every Day Smoker  . Smokeless tobacco: Never Used  Substance Use Topics  . Alcohol use: Never  . Drug use: Never      Family History No bleeding disorders, clotting disorders, autoimmune diseases, or aneurysms  Allergies  Allergen Reactions  . Codeine Nausea And Vomiting  . Oxycodone-Acetaminophen Nausea And Vomiting  . Sulfa Antibiotics Nausea And Vomiting     REVIEW OF SYSTEMS (Negative unless checked)  Constitutional: [] ?Weight loss  [] ?Fever  [] ?Chills Cardiac: [] ?Chest pain   [] ?Chest pressure   [] ?Palpitations   [] ?Shortness of breath when laying flat   [] ?Shortness of breath at rest   [] ?Shortness of breath with exertion. Vascular:  [] ?Pain in legs with walking   [] ?Pain in legs at rest   [] ?Pain in legs when laying flat   [] ?Claudication   [x] ?Pain  in feet when walking  [x] ?Pain in feet at rest  [x] ?Pain in feet when laying flat   [] ?History of DVT   [] ?Phlebitis   [] ?Swelling in legs   [] ?Varicose veins   [x] ?Non-healing ulcers Pulmonary:   [] ?Uses home oxygen   [] ?Productive cough   [] ?Hemoptysis   [] ?Wheeze  [] ?COPD   [] ?Asthma Neurologic:  [] ?Dizziness  [] ?Blackouts   [x] ?Seizures   [] ?History of stroke   [] ?History of TIA  [] ?Aphasia   [] ?Temporary blindness   [] ?Dysphagia   [] ?Weakness or numbness in arms   [] ?Weakness or numbness in legs Musculoskeletal:  [] ?Arthritis   [] ?Joint swelling   [] ?Joint pain   [] ?Low back  pain Hematologic:  [] ?Easy bruising  [] ?Easy bleeding   [] ?Hypercoagulable state   [] ?Anemic  [] ?Hepatitis Gastrointestinal:  [] ?Blood in stool   [] ?Vomiting blood  [] ?Gastroesophageal reflux/heartburn   [] ?Abdominal pain Genitourinary:  [] ?Chronic kidney disease   [] ?Difficult urination  [] ?Frequent urination  [] ?Burning with urination   [] ?Hematuria Skin:  [] ?Rashes   [x] ?Ulcers   [x] ?Wounds Psychological:  [] ?History of anxiety   [x] ? History of major depression.   Physical Examination  BP (!) 158/75   Pulse 83   Ht 5\' 4"  (1.626 m)   Wt 158 lb (71.7 kg)   BMI 27.12 kg/m  Gen:  WD/WN, NAD Head: [place]/AT, No temporalis wasting. Ear/Nose/Throat: Hearing grossly intact, nares w/o erythema or drainage Eyes: Conjunctiva clear. Sclera non-icteric Neck: Supple.  Trachea midline Pulmonary:  Good air movement, no use of accessory muscles.  Cardiac: RRR, no JVD Vascular:  Vessel Right Left  Radial Palpable Palpable                          PT 2+ Palpable 1+ Palpable  DP 2+ Palpable 1+ Palpable   Gastrointestinal: soft, non-tender/non-distended. No guarding/reflex.  Musculoskeletal: M/S 5/5 throughout.  No deformity or atrophy. No edema. Neurologic: Sensation grossly intact in extremities.  Symmetrical.  Speech is fluent.  Psychiatric: Judgment intact, Mood & affect appropriate for pt's clinical situation. Dermatologic: No rashes or ulcers noted.  No cellulitis or open wounds.       Labs Recent Results (from the past 2160 hour(s))  SARS CORONAVIRUS 2 (TAT 6-24 HRS) Nasopharyngeal Nasopharyngeal Swab     Status: None   Collection Time: 02/27/21 12:55 PM   Specimen: Nasopharyngeal Swab  Result Value Ref Range   SARS Coronavirus 2 NEGATIVE NEGATIVE    Comment: (NOTE) SARS-CoV-2 target nucleic acids are NOT DETECTED.  The SARS-CoV-2 RNA is generally detectable in upper and lower respiratory specimens during the acute phase of infection. Negative results do not preclude  SARS-CoV-2 infection, do not rule out co-infections with other pathogens, and should not be used as the sole basis for treatment or other patient management decisions. Negative results must be combined with clinical observations, patient history, and epidemiological information. The expected result is Negative.  Fact Sheet for Patients: SugarRoll.be  Fact Sheet for Healthcare Providers: https://www.woods-mathews.com/  This test is not yet approved or cleared by the [place] FDA and  has been authorized for detection and/or diagnosis of SARS-CoV-2 by FDA under an Emergency Use Authorization (EUA). This EUA will remain  in effect (meaning this test can be used) for the duration of the COVID-19 declaration under Se ction 564(b)(1) of the Act, 21 U.S.C. section 360bbb-3(b)(1), unless the authorization is terminated or revoked sooner.  Performed at [facility], [place] [address], [place],  [address]  BUN     Status: None   Collection Time: 03/01/21  8:34 AM  Result Value Ref Range   BUN 16 8 - 23 mg/dL    Comment: Performed at [facility], [address] [place], [place] [address]  Creatinine, serum     Status: None   Collection Time: 03/01/21  8:34 AM  Result Value Ref Range   Creatinine, Ser 0.92 0.44 - 1.00 mg/dL   GFR, Estimated >60 >60 mL/min    Comment: (NOTE) Calculated using the CKD-EPI Creatinine Equation (2021) Performed at [facility], [address] [place], [place] [address]   CBC with Differential     Status: Abnormal   Collection Time: 03/01/21  7:49 PM  Result Value Ref Range   WBC 13.7 (H) 4.0 - 10.5 K/uL   RBC 4.79 3.87 - 5.11 MIL/uL   Hemoglobin 15.5 (H) 12.0 - 15.0 g/dL   HCT 45.8 36.0 - 46.0 %   MCV 95.6 80.0 - 100.0 fL   MCH 32.4 26.0 - 34.0 pg   MCHC 33.8 30.0 - 36.0 g/dL   RDW 13.9 11.5 - 15.5 %   Platelets 252 150 - 400 K/uL   nRBC 0.0 0.0 - 0.2 %   Neutrophils  Relative % 85 %   Neutro Abs 11.5 (H) 1.7 - 7.7 K/uL   Lymphocytes Relative 10 %   Lymphs Abs 1.4 0.7 - 4.0 K/uL   Monocytes Relative 5 %   Monocytes Absolute 0.7 0.1 - 1.0 K/uL   Eosinophils Relative 0 %   Eosinophils Absolute 0.0 0.0 - 0.5 K/uL   Basophils Relative 0 %   Basophils Absolute 0.0 0.0 - 0.1 K/uL   Immature Granulocytes 0 %   Abs Immature Granulocytes 0.06 0.00 - 0.07 K/uL    Comment: Performed at [facility], [address], [place], [place] [id number]  Basic metabolic panel     Status: Abnormal   Collection Time: 03/01/21  7:49 PM  Result Value Ref Range   Sodium 138 135 - 145 mmol/L   Potassium 3.9 3.5 - 5.1 mmol/L   Chloride 106 98 - 111 mmol/L   CO2 23 22 - 32 mmol/L   Glucose, Bld 121 (H) 70 - 99 mg/dL    Comment: Glucose reference range applies only to samples taken after fasting for at least 8 hours.   BUN 16 8 - 23 mg/dL   Creatinine, Ser 0.75 0.44 - 1.00 mg/dL   Calcium 9.1 8.9 - 10.3 mg/dL   GFR, Estimated >60 >60 mL/min    Comment: (NOTE) Calculated using the CKD-EPI Creatinine Equation (2021)    Anion gap 9 5 - 15    Comment: Performed at [facility], [address] [place], [place] [address]  Protime-INR     Status: None   Collection Time: 03/01/21  7:49 PM  Result Value Ref Range   Prothrombin Time 12.7 11.4 - 15.2 seconds   INR 1.0 0.8 - 1.2    Comment: (NOTE) INR goal varies based on device and disease states. Performed at [facility], [address] [place], [place] [address]   Resp Panel by RT-PCR (Flu A&B, Covid) Nasopharyngeal Swab     Status: None   Collection Time: 03/01/21  7:49 PM   Specimen: Nasopharyngeal Swab; Nasopharyngeal(NP) swabs in vial transport medium  Result Value Ref Range   SARS Coronavirus 2 by RT PCR NEGATIVE NEGATIVE    Comment: (NOTE) SARS-CoV-2 target nucleic acids are NOT  DETECTED.  The SARS-CoV-2 RNA is generally detectable in upper respiratory specimens during the acute  phase of infection. The lowest concentration of SARS-CoV-2 viral copies this assay can detect is 138 copies/mL. A negative result does not preclude SARS-Cov-2 infection and should not be used as the sole basis for treatment or other patient management decisions. A negative result may occur with  improper specimen collection/handling, submission of specimen other than nasopharyngeal swab, presence of viral mutation(s) within the areas targeted by this assay, and inadequate number of viral copies(<138 copies/mL). A negative result must be combined with clinical observations, patient history, and epidemiological information. The expected result is Negative.  Fact Sheet for Patients:  EntrepreneurPulse.com.au  Fact Sheet for Healthcare Providers:  IncredibleEmployment.be  This test is no t yet approved or cleared by the [place] FDA and  has been authorized for detection and/or diagnosis of SARS-CoV-2 by FDA under an Emergency Use Authorization (EUA). This EUA will remain  in effect (meaning this test can be used) for the duration of the COVID-19 declaration under Section 564(b)(1) of the Act, 21 U.S.C.section 360bbb-3(b)(1), unless the authorization is terminated  or revoked sooner.       Influenza A by PCR NEGATIVE NEGATIVE   Influenza B by PCR NEGATIVE NEGATIVE    Comment: (NOTE) The Xpert Xpress SARS-CoV-2/FLU/RSV plus assay is intended as an aid in the diagnosis of influenza from Nasopharyngeal swab specimens and should not be used as a sole basis for treatment. Nasal washings and aspirates are unacceptable for Xpert Xpress SARS-CoV-2/FLU/RSV testing.  Fact Sheet for Patients: EntrepreneurPulse.com.au  Fact Sheet for Healthcare Providers: IncredibleEmployment.be  This test is not yet approved or cleared by the [place] FDA and has been authorized for detection and/or diagnosis of SARS-CoV-2  by FDA under an Emergency Use Authorization (EUA). This EUA will remain in effect (meaning this test can be used) for the duration of the COVID-19 declaration under Section 564(b)(1) of the Act, 21 U.S.C. section 360bbb-3(b)(1), unless the authorization is terminated or revoked.  Performed at [facility], [address] [place], [place] [address]   Glucose, capillary     Status: Abnormal   Collection Time: 03/01/21 11:29 PM  Result Value Ref Range   Glucose-Capillary 116 (H) 70 - 99 mg/dL    Comment: Glucose reference range applies only to samples taken after fasting for at least 8 hours.  MRSA PCR Screening     Status: None   Collection Time: 03/02/21 12:10 AM   Specimen: Nasopharyngeal  Result Value Ref Range   MRSA by PCR NEGATIVE NEGATIVE    Comment:        The GeneXpert MRSA Assay (FDA approved for NASAL specimens only), is one component of a comprehensive MRSA colonization surveillance program. It is not intended to diagnose MRSA infection nor to guide or monitor treatment for MRSA infections. Performed at [facility], [address] [place],  [address]   CBC     Status: None   Collection Time: 03/02/21  9:12 AM  Result Value Ref Range   WBC 9.7 4.0 - 10.5 K/uL   RBC 3.99 3.87 - 5.11 MIL/uL   Hemoglobin 13.0 12.0 - 15.0 g/dL   HCT 38.0 36.0 - 46.0 %   MCV 95.2 80.0 - 100.0 fL   MCH 32.6 26.0 - 34.0 pg   MCHC 34.2 30.0 - 36.0 g/dL   RDW 13.9 11.5 - 15.5 %   Platelets 209 150 - 400 K/uL   nRBC  0.0 0.0 - 0.2 %    Comment: Performed at [facility], [address] [place], [place] [address]  Basic metabolic panel     Status: Abnormal   Collection Time: 03/02/21  9:12 AM  Result Value Ref Range   Sodium 136 135 - 145 mmol/L   Potassium 3.9 3.5 - 5.1 mmol/L   Chloride 104 98 - 111 mmol/L   CO2 23 22 - 32 mmol/L   Glucose, Bld 112 (H) 70 - 99 mg/dL    Comment: Glucose reference range applies only to samples taken after  fasting for at least 8 hours.   BUN 13 8 - 23 mg/dL   Creatinine, Ser 0.75 0.44 - 1.00 mg/dL   Calcium 8.4 (L) 8.9 - 10.3 mg/dL   GFR, Estimated >60 >60 mL/min    Comment: (NOTE) Calculated using the CKD-EPI Creatinine Equation (2021)    Anion gap 9 5 - 15    Comment: Performed at [facility], [address] [place], [place] [address]    Radiology PERIPHERAL VASCULAR CATHETERIZATION  Result Date: 03/01/2021 See op note  PERIPHERAL VASCULAR CATHETERIZATION  Result Date: 03/01/2021 See op note   Assessment/Plan  Atherosclerosis of native arteries of the extremities with ulceration ([place]) Her ABIs today are 1.10 on the right with multiphasic waveforms and normal digital pressure.  Her ABIs improved to 0.73 on the left with fairly strong monophasic waveforms. She will continue anticoagulation.  She is doing reasonably well.  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  This may take a while to resolve.  Return to clinic in 3 months with noninvasive studies.  Not much symptom on the left side that would warrant any treatment at this time and her perfusion is improved after left iliac intervention with her right leg procedure.  Tobacco use Tobacco cessation will be important for durability of this intervention and avoiding progression of disease.    Leotis Pain, MD  03/30/2021 9:16 AM    This note was created with Dragon medical transcription system.  Any errors from dictation are purely unintentional

## 2021-03-30 NOTE — Assessment & Plan Note (Signed)
Her ABIs today are 1.10 on the right with multiphasic waveforms and normal digital pressure.  Her ABIs improved to 0.73 on the left with fairly strong monophasic waveforms. She will continue anticoagulation.  She is doing reasonably well.  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  This may take a while to resolve.  Return to clinic in 3 months with noninvasive studies.  Not much symptom on the left side that would warrant any treatment at this time and her perfusion is improved after left iliac intervention with her right leg procedure.

## 2021-06-30 ENCOUNTER — Other Ambulatory Visit: Payer: Self-pay

## 2021-06-30 ENCOUNTER — Inpatient Hospital Stay
Admission: EM | Admit: 2021-06-30 | Discharge: 2021-07-03 | DRG: 271 | Disposition: A | Discharge status: Home / Self Care | Payer: Medicare PPO | Attending: Vascular Surgery | Admitting: Vascular Surgery

## 2021-06-30 ENCOUNTER — Emergency Department: Payer: Medicare PPO

## 2021-06-30 DIAGNOSIS — Z720 Tobacco use: Secondary | ICD-10-CM | POA: Diagnosis present

## 2021-06-30 DIAGNOSIS — T82868A Thrombosis of vascular prosthetic devices, implants and grafts, initial encounter: Principal | ICD-10-CM | POA: Diagnosis present

## 2021-06-30 DIAGNOSIS — I998 Other disorder of circulatory system: Secondary | ICD-10-CM

## 2021-06-30 DIAGNOSIS — Y831 Surgical operation with implant of artificial internal device as the cause of abnormal reaction of the patient, or of later complication, without mention of misadventure at the time of the procedure: Secondary | ICD-10-CM | POA: Diagnosis present

## 2021-06-30 DIAGNOSIS — I8290 Acute embolism and thrombosis of unspecified vein: Secondary | ICD-10-CM

## 2021-06-30 DIAGNOSIS — Z7982 Long term (current) use of aspirin: Secondary | ICD-10-CM

## 2021-06-30 DIAGNOSIS — Z7901 Long term (current) use of anticoagulants: Secondary | ICD-10-CM

## 2021-06-30 DIAGNOSIS — I743 Embolism and thrombosis of arteries of the lower extremities: Secondary | ICD-10-CM | POA: Diagnosis present

## 2021-06-30 DIAGNOSIS — E785 Hyperlipidemia, unspecified: Secondary | ICD-10-CM | POA: Diagnosis present

## 2021-06-30 DIAGNOSIS — Z882 Allergy status to sulfonamides status: Secondary | ICD-10-CM

## 2021-06-30 DIAGNOSIS — I70209 Unspecified atherosclerosis of native arteries of extremities, unspecified extremity: Secondary | ICD-10-CM

## 2021-06-30 DIAGNOSIS — Z79899 Other long term (current) drug therapy: Secondary | ICD-10-CM

## 2021-06-30 DIAGNOSIS — M79604 Pain in right leg: Secondary | ICD-10-CM

## 2021-06-30 DIAGNOSIS — I70201 Unspecified atherosclerosis of native arteries of extremities, right leg: Secondary | ICD-10-CM | POA: Diagnosis present

## 2021-06-30 DIAGNOSIS — F319 Bipolar disorder, unspecified: Secondary | ICD-10-CM | POA: Diagnosis present

## 2021-06-30 DIAGNOSIS — Z7902 Long term (current) use of antithrombotics/antiplatelets: Secondary | ICD-10-CM

## 2021-06-30 DIAGNOSIS — M62261 Nontraumatic ischemic infarction of muscle, right lower leg: Secondary | ICD-10-CM | POA: Diagnosis present

## 2021-06-30 DIAGNOSIS — F17210 Nicotine dependence, cigarettes, uncomplicated: Secondary | ICD-10-CM | POA: Diagnosis present

## 2021-06-30 DIAGNOSIS — Z20822 Contact with and (suspected) exposure to covid-19: Secondary | ICD-10-CM | POA: Diagnosis present

## 2021-06-30 DIAGNOSIS — Z885 Allergy status to narcotic agent status: Secondary | ICD-10-CM

## 2021-06-30 LAB — CBC WITH DIFFERENTIAL/PLATELET
Abs Immature Granulocytes: 0.04 10*3/uL (ref 0.00–0.07)
Basophils Absolute: 0.1 10*3/uL (ref 0.0–0.1)
Basophils Relative: 1 %
Eosinophils Absolute: 0.1 10*3/uL (ref 0.0–0.5)
Eosinophils Relative: 1 %
HCT: 44.5 % (ref 36.0–46.0)
Hemoglobin: 15.2 g/dL — ABNORMAL HIGH (ref 12.0–15.0)
Immature Granulocytes: 0 %
Lymphocytes Relative: 26 %
Lymphs Abs: 2.6 10*3/uL (ref 0.7–4.0)
MCH: 32.8 pg (ref 26.0–34.0)
MCHC: 34.2 g/dL (ref 30.0–36.0)
MCV: 96.1 fL (ref 80.0–100.0)
Monocytes Absolute: 0.6 10*3/uL (ref 0.1–1.0)
Monocytes Relative: 6 %
Neutro Abs: 6.5 10*3/uL (ref 1.7–7.7)
Neutrophils Relative %: 66 %
Platelets: 371 10*3/uL (ref 150–400)
RBC: 4.63 MIL/uL (ref 3.87–5.11)
RDW: 14.3 % (ref 11.5–15.5)
WBC: 9.8 10*3/uL (ref 4.0–10.5)
nRBC: 0 % (ref 0.0–0.2)

## 2021-06-30 LAB — BASIC METABOLIC PANEL
Anion gap: 7 (ref 5–15)
BUN: 7 mg/dL — ABNORMAL LOW (ref 8–23)
CO2: 25 mmol/L (ref 22–32)
Calcium: 9.2 mg/dL (ref 8.9–10.3)
Chloride: 106 mmol/L (ref 98–111)
Creatinine, Ser: 0.89 mg/dL (ref 0.44–1.00)
GFR, Estimated: >60 mL/min (ref >60)
Glucose, Bld: 97 mg/dL (ref 70–99)
Potassium: 4 mmol/L (ref 3.5–5.1)
Sodium: 138 mmol/L (ref 135–145)

## 2021-06-30 IMAGING — CT CT ANGIO AOBIFEM WO/W CM
2 of 10 series · 11 of 46 positions shown, 15 images · IV contrast (APPLIED)
Comparison: Angiography from [DATE]

CLINICAL DATA: Right hip and thigh pain for several days, history
of prior vascular stent placement

EXAM:
CT ANGIOGRAPHY OF ABDOMINAL AORTA WITH ILIOFEMORAL RUNOFF
TECHNIQUE: Multidetector CT imaging of the abdomen, pelvis and lower
extremities was performed using the standard protocol during bolus
administration of intravenous contrast. Multiplanar CT image
reconstructions and MIPs were obtained to evaluate the vascular
anatomy.
CONTRAST:  100mL OMNIPAQUE IOHEXOL 350 MG/ML SOLN

[Series 4: axial arterial upper · axial · arterial · 0.88mm/px · z∈[+827,+1529]mm · 10 of 282 slices shown, 13 images]
[im 32/282  soft-tissue]
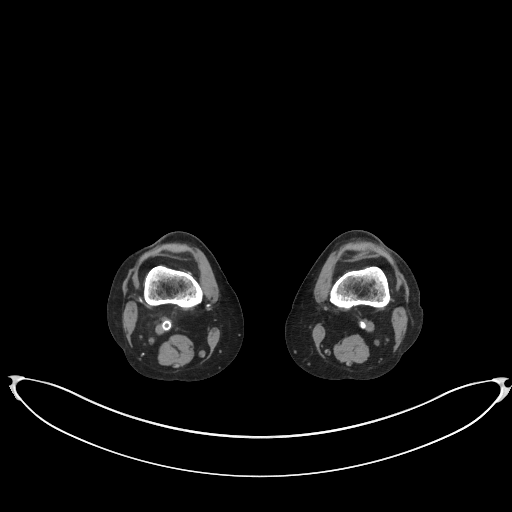
[im 32/282  bone]
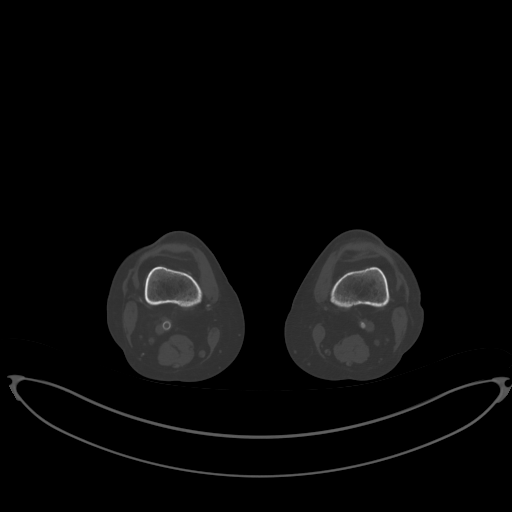
[im 63/282  soft-tissue]
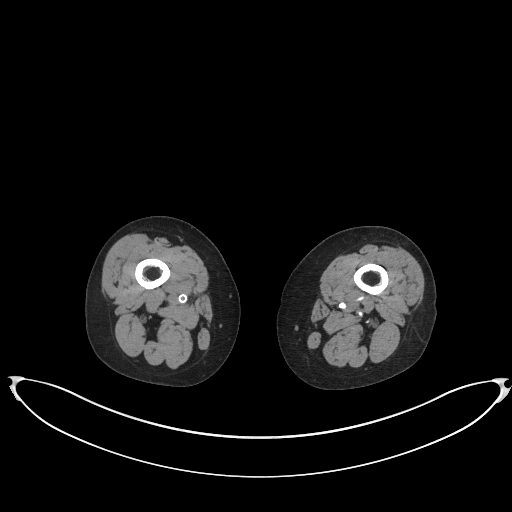
[im 94/282  soft-tissue]
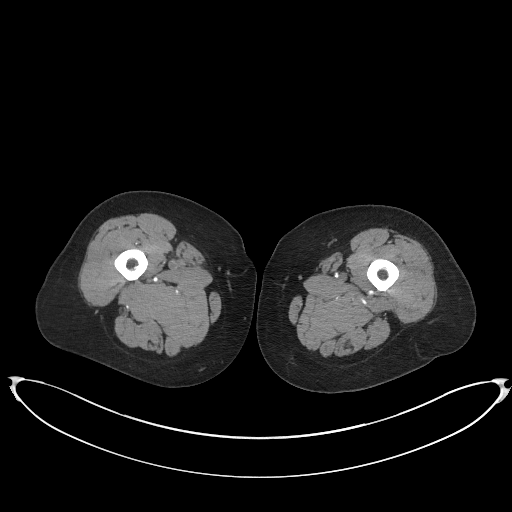
[im 125/282  soft-tissue]
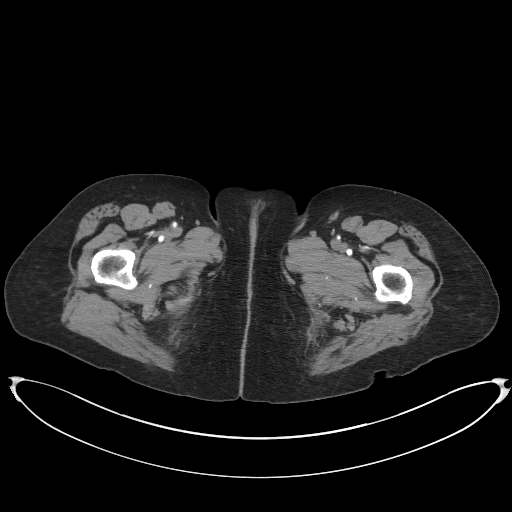
[im 157/282  soft-tissue]
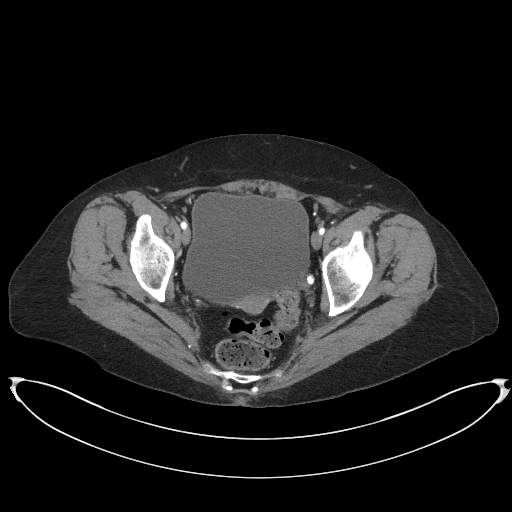
[im 188/282  soft-tissue]
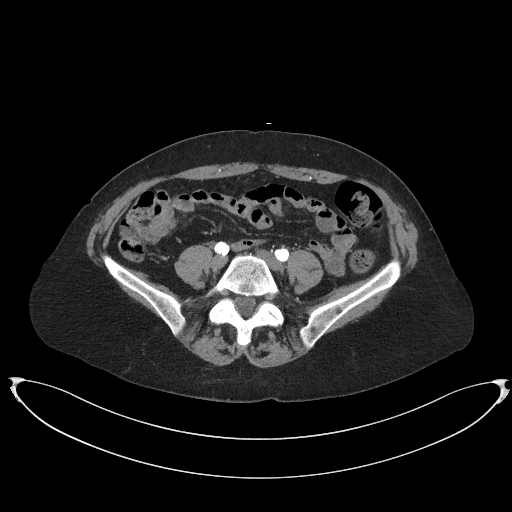
[im 219/282  soft-tissue]
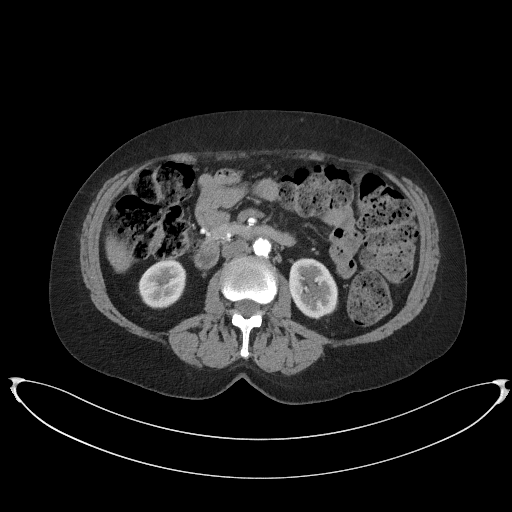
[im 219/282  lung]
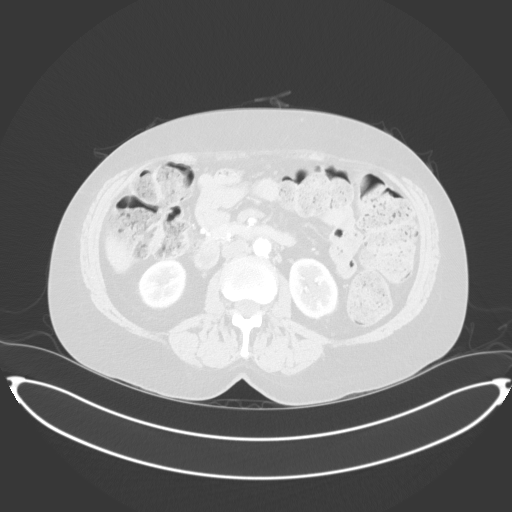
[im 235/282  lung]
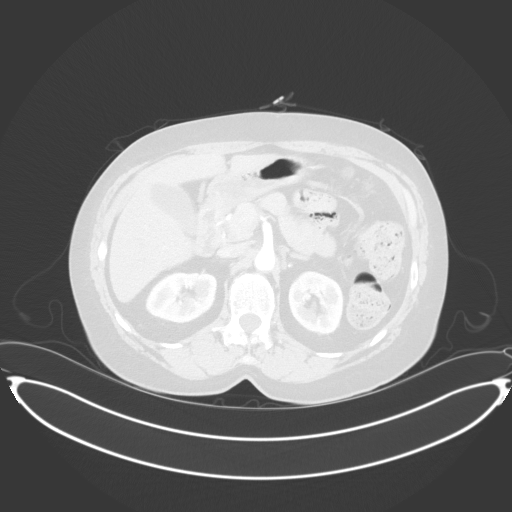
[im 250/282  soft-tissue]
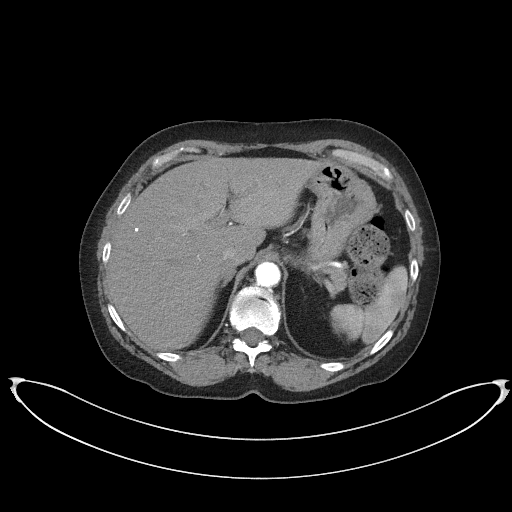
[im 250/282  lung]
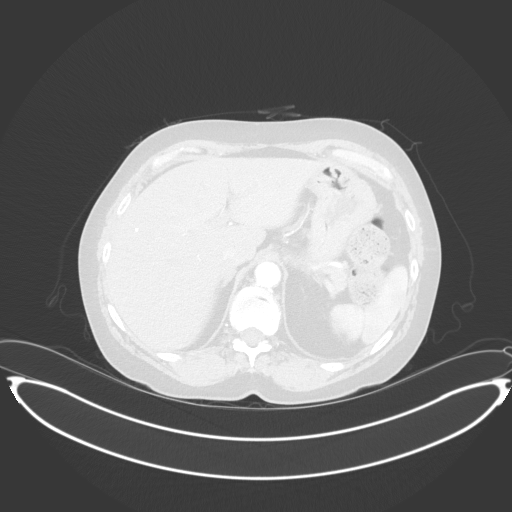
[im 266/282  lung]
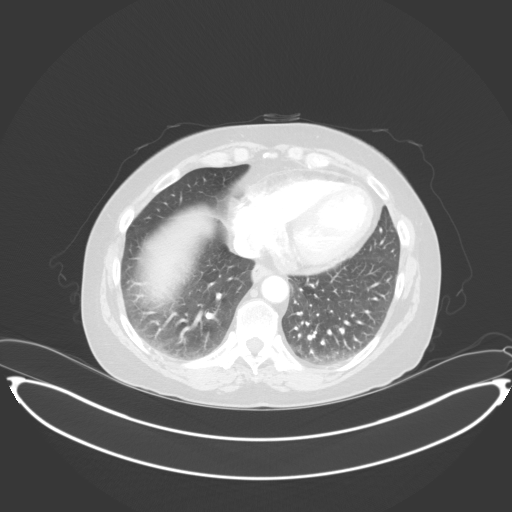

[Series 12: coronal lower · coronal · 0.64mm/px · 1 of 129 slices shown, 2 images]
[im 65/129  soft-tissue]
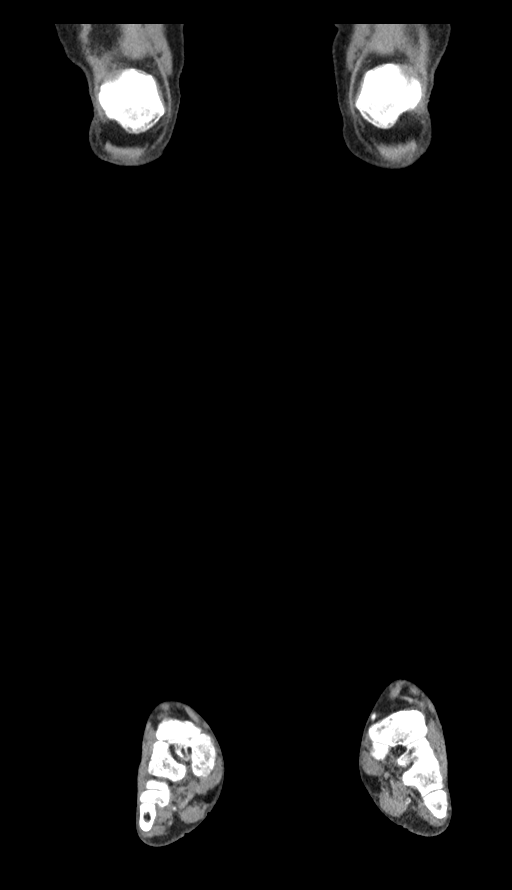
[im 65/129  bone]
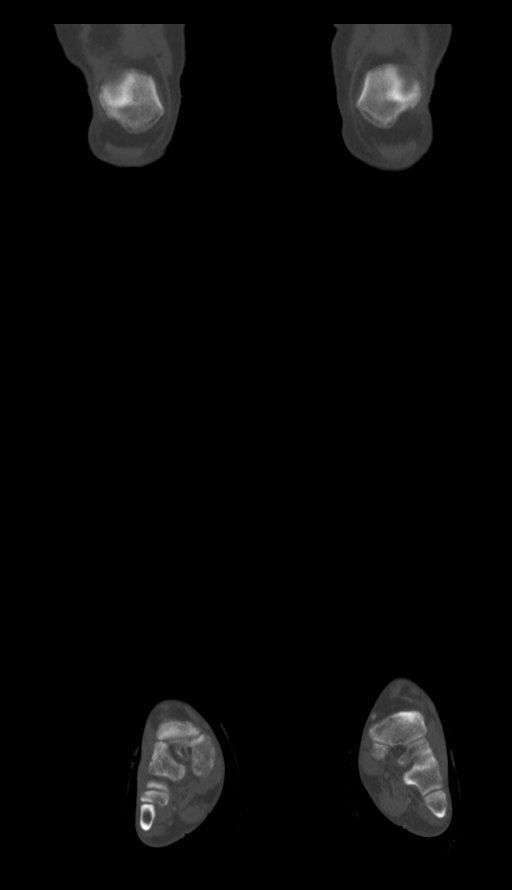

[11 of 46 positions shown; findings below may reference images not displayed]

FINDINGS: VASCULAR

Aorta: Atherosclerotic calcifications of the abdominal aorta are
noted. No aneurysmal dilatation is seen.

Celiac: Median arcuate ligament compression of the proximal celiac
axis is noted.

SMA: Patent without evidence of aneurysm, dissection, vasculitis or
significant stenosis.

Renals: Mild atherosclerotic changes are noted without focal
stenosis. Single renal arteries are seen bilaterally.

IMA: Patent without evidence of aneurysm, dissection, vasculitis or
significant stenosis.

RIGHT Lower Extremity

Inflow: Iliac artery stent is noted in the common iliac artery
extending into the proximal aspect of the external iliac artery.
Internal and external iliac artery are patent. Common femoral artery
demonstrates some calcification.

Runoff: The superficial femoral artery is somewhat diminutive with
occlusion approximately 10 cm from the origin of the superficial
femoral artery. The native superficial femoral artery is thrombosed
for a short segment prior to visualization of thrombosed superficial
femoral artery and popliteal artery stent. Below the popliteal
arterial stent, there is reconstitution of the posterior tibial and
peroneal artery. Anterior tibial artery is not well visualized.
Posterior tibial artery continues into the foot.

LEFT Lower Extremity

Inflow: Common iliac artery on the left demonstrates focal stent
which is widely patent. The external and internal iliac artery are
patent. Common femoral artery demonstrates some calcification.

Runoff: The superficial femoral artery on the left is diminutive as
well but larger than that seen on the right with heavy vascular
calcification identified. It occludes just above the level of the
adductor canal however reconstitution via muscular collaterals of
the popliteal artery is seen. Popliteal artery shows patent
trifurcation with three-vessel runoff to the ankle and the anterior
and posterior tibial arteries extending into the foot.

Veins: No specific venous abnormality is noted.

Review of the MIP images confirms the above findings.

NON-VASCULAR

Lower chest: Lung bases are free of acute infiltrate or sizable
effusion.

Hepatobiliary: Few scattered calcifications are noted within the
liver. The gallbladder is within normal limits.

Pancreas: Unremarkable. No pancreatic ductal dilatation or
surrounding inflammatory changes.

Spleen: Normal in size without focal abnormality.

Adrenals/Urinary Tract: Adrenal glands are unremarkable. Kidneys
show no renal calculi or obstructive changes. The bladder is well
distended.

Stomach/Bowel: Fecal material is noted throughout the colon
consistent with a mild degree of constipation although no
obstructive changes are seen. The appendix is not well visualized
consistent with prior surgical history. Small bowel and stomach are
within normal limits.

Lymphatic: No sizable lymphadenopathy is noted.

Reproductive: Uterus and bilateral adnexa are unremarkable.

Other: No abdominal wall hernia or abnormality. No abdominopelvic
ascites.

Musculoskeletal: Degenerative changes of lumbar spine are noted. No
acute bony abnormality is seen.
IMPRESSION: VASCULAR

Patent arterial stents within the common iliac arteries bilaterally.

Occluded right superficial femoral and popliteal arteries stent with
occlusion of the native superficial femoral artery approximately 10
cm beyond its origin. There is reconstitution via muscular
collaterals of the peroneal and posterior tibial artery as
described.

On the left, the superficial femoral artery is diminutive and shows
occlusion at the level of the adductor canal. The degree of
occlusion is short in segment measuring approximately 2.4 cm with
significant reconstitution via muscular collaterals. Three-vessel
runoff to the left ankle is noted.

NON-VASCULAR

Changes suggestive of mild colonic constipation. No other
significant nonvascular abnormality is noted.

## 2021-06-30 MED ORDER — IOHEXOL 350 MG/ML SOLN
100.0000 mL | Freq: Once | INTRAVENOUS | Status: AC | PRN
  Administered 2021-06-30: 100 mL via INTRAVENOUS

## 2021-06-30 NOTE — ED Triage Notes (Signed)
C/o right hip/thigh pain/numbness x several days. Pt. States she had stents placed in that leg 3 months ago. Pt. Denies any CP, SOB, dizziness.

## 2021-06-30 NOTE — ED Notes (Signed)
Pt. Resting in stretcher, denies need currently, NAD.

## 2021-07-01 ENCOUNTER — Encounter: Payer: Self-pay | Admitting: Anesthesiology

## 2021-07-01 ENCOUNTER — Encounter
Admission: EM | Disposition: A | Discharge status: Home / Self Care | Payer: Self-pay | Source: Home / Self Care | Attending: Vascular Surgery

## 2021-07-01 ENCOUNTER — Inpatient Hospital Stay: Payer: Medicare PPO

## 2021-07-01 DIAGNOSIS — F319 Bipolar disorder, unspecified: Secondary | ICD-10-CM | POA: Diagnosis present

## 2021-07-01 DIAGNOSIS — E785 Hyperlipidemia, unspecified: Secondary | ICD-10-CM | POA: Diagnosis present

## 2021-07-01 DIAGNOSIS — M62261 Nontraumatic ischemic infarction of muscle, right lower leg: Secondary | ICD-10-CM | POA: Diagnosis present

## 2021-07-01 DIAGNOSIS — T82868A Thrombosis of vascular prosthetic devices, implants and grafts, initial encounter: Principal | ICD-10-CM

## 2021-07-01 DIAGNOSIS — I743 Embolism and thrombosis of arteries of the lower extremities: Secondary | ICD-10-CM | POA: Diagnosis present

## 2021-07-01 DIAGNOSIS — I8290 Acute embolism and thrombosis of unspecified vein: Secondary | ICD-10-CM | POA: Diagnosis present

## 2021-07-01 DIAGNOSIS — Z7902 Long term (current) use of antithrombotics/antiplatelets: Secondary | ICD-10-CM | POA: Diagnosis not present

## 2021-07-01 DIAGNOSIS — Z20822 Contact with and (suspected) exposure to covid-19: Secondary | ICD-10-CM | POA: Diagnosis present

## 2021-07-01 DIAGNOSIS — Z7901 Long term (current) use of anticoagulants: Secondary | ICD-10-CM | POA: Diagnosis not present

## 2021-07-01 DIAGNOSIS — Z7982 Long term (current) use of aspirin: Secondary | ICD-10-CM | POA: Diagnosis not present

## 2021-07-01 DIAGNOSIS — I998 Other disorder of circulatory system: Secondary | ICD-10-CM | POA: Diagnosis not present

## 2021-07-01 DIAGNOSIS — Z79899 Other long term (current) drug therapy: Secondary | ICD-10-CM | POA: Diagnosis not present

## 2021-07-01 DIAGNOSIS — I70201 Unspecified atherosclerosis of native arteries of extremities, right leg: Secondary | ICD-10-CM | POA: Diagnosis present

## 2021-07-01 DIAGNOSIS — F17210 Nicotine dependence, cigarettes, uncomplicated: Secondary | ICD-10-CM | POA: Diagnosis present

## 2021-07-01 DIAGNOSIS — I70221 Atherosclerosis of native arteries of extremities with rest pain, right leg: Secondary | ICD-10-CM | POA: Diagnosis not present

## 2021-07-01 DIAGNOSIS — Y831 Surgical operation with implant of artificial internal device as the cause of abnormal reaction of the patient, or of later complication, without mention of misadventure at the time of the procedure: Secondary | ICD-10-CM | POA: Diagnosis present

## 2021-07-01 DIAGNOSIS — Z885 Allergy status to narcotic agent status: Secondary | ICD-10-CM | POA: Diagnosis not present

## 2021-07-01 DIAGNOSIS — Z720 Tobacco use: Secondary | ICD-10-CM | POA: Diagnosis not present

## 2021-07-01 DIAGNOSIS — Z882 Allergy status to sulfonamides status: Secondary | ICD-10-CM | POA: Diagnosis not present

## 2021-07-01 HISTORY — PX: LOWER EXTREMITY ANGIOGRAPHY: CATH118251

## 2021-07-01 LAB — BASIC METABOLIC PANEL
Anion gap: 8 (ref 5–15)
BUN: 8 mg/dL (ref 8–23)
CO2: 24 mmol/L (ref 22–32)
Calcium: 8.7 mg/dL — ABNORMAL LOW (ref 8.9–10.3)
Chloride: 107 mmol/L (ref 98–111)
Creatinine, Ser: 0.73 mg/dL (ref 0.44–1.00)
GFR, Estimated: >60 mL/min (ref >60)
Glucose, Bld: 98 mg/dL (ref 70–99)
Potassium: 3.8 mmol/L (ref 3.5–5.1)
Sodium: 139 mmol/L (ref 135–145)

## 2021-07-01 LAB — GLUCOSE, CAPILLARY: Glucose-Capillary: 101 mg/dL — ABNORMAL HIGH (ref 70–99)

## 2021-07-01 LAB — CBC
HCT: 42.5 % (ref 36.0–46.0)
HCT: 45.9 % (ref 36.0–46.0)
Hemoglobin: 14.2 g/dL (ref 12.0–15.0)
Hemoglobin: 15.2 g/dL — ABNORMAL HIGH (ref 12.0–15.0)
MCH: 31.6 pg (ref 26.0–34.0)
MCH: 32.2 pg (ref 26.0–34.0)
MCHC: 33.4 g/dL (ref 30.0–36.0)
MCHC: 33.1 g/dL (ref 30.0–36.0)
MCV: 94.7 fL (ref 80.0–100.0)
MCV: 97.2 fL (ref 80.0–100.0)
Platelets: 316 10*3/uL (ref 150–400)
Platelets: 253 10*3/uL (ref 150–400)
RBC: 4.49 MIL/uL (ref 3.87–5.11)
RBC: 4.72 MIL/uL (ref 3.87–5.11)
RDW: 14.3 % (ref 11.5–15.5)
RDW: 14 % (ref 11.5–15.5)
WBC: 8.1 10*3/uL (ref 4.0–10.5)
WBC: 12.2 10*3/uL — ABNORMAL HIGH (ref 4.0–10.5)
nRBC: 0 % (ref 0.0–0.2)
nRBC: 0 % (ref 0.0–0.2)

## 2021-07-01 LAB — HEPARIN LEVEL (UNFRACTIONATED)
Heparin Unfractionated: 0.62 IU/mL (ref 0.30–0.70)
Heparin Unfractionated: >1.1 IU/mL — ABNORMAL HIGH (ref 0.30–0.70)

## 2021-07-01 LAB — FIBRINOGEN: Fibrinogen: 231 mg/dL (ref 210–475)

## 2021-07-01 LAB — APTT
aPTT: 125 seconds — ABNORMAL HIGH (ref 24–36)
aPTT: 30 seconds (ref 24–36)
aPTT: 55 seconds — ABNORMAL HIGH (ref 24–36)

## 2021-07-01 LAB — PROTIME-INR
INR: 1 (ref 0.8–1.2)
Prothrombin Time: 13.6 seconds (ref 11.4–15.2)

## 2021-07-01 LAB — SARS CORONAVIRUS 2 BY RT PCR (HOSPITAL ORDER, PERFORMED IN ~~LOC~~ HOSPITAL LAB): SARS Coronavirus 2: NEGATIVE

## 2021-07-01 LAB — RESP PANEL BY RT-PCR (FLU A&B, COVID) ARPGX2
Influenza A by PCR: NEGATIVE
Influenza B by PCR: NEGATIVE
SARS Coronavirus 2 by RT PCR: NEGATIVE

## 2021-07-01 LAB — MRSA NEXT GEN BY PCR, NASAL: MRSA by PCR Next Gen: NOT DETECTED

## 2021-07-01 SURGERY — LOWER EXTREMITY ANGIOGRAPHY
Anesthesia: Moderate Sedation | Laterality: Right

## 2021-07-01 MED ORDER — IODIXANOL 320 MG/ML IV SOLN
INTRAVENOUS | Status: DC | PRN
  Administered 2021-07-01: 65 mL via INTRA_ARTERIAL

## 2021-07-01 MED ORDER — CLINDAMYCIN PHOSPHATE 300 MG/50ML IV SOLN
INTRAVENOUS | Status: AC
  Filled 2021-07-01: qty 50

## 2021-07-01 MED ORDER — SODIUM CHLORIDE 0.9 % IV SOLN
1.0000 mg/h | INTRAVENOUS | Status: DC
  Administered 2021-07-01: 1 mg/h
  Filled 2021-07-01: qty 10

## 2021-07-01 MED ORDER — ACETAMINOPHEN 325 MG PO TABS
650.0000 mg | ORAL_TABLET | Freq: Four times a day (QID) | ORAL | Status: DC | PRN
  Administered 2021-07-03: 650 mg via ORAL
  Filled 2021-07-01: qty 2

## 2021-07-01 MED ORDER — MIDAZOLAM HCL 2 MG/2ML IJ SOLN
INTRAMUSCULAR | Status: DC | PRN
  Administered 2021-07-01 (×3): 0.5 mg via INTRAVENOUS

## 2021-07-01 MED ORDER — SODIUM CHLORIDE 0.9 % IV SOLN: INTRAVENOUS | Status: DC

## 2021-07-01 MED ORDER — ALBUTEROL SULFATE (2.5 MG/3ML) 0.083% IN NEBU: 2.5000 mg | INHALATION_SOLUTION | RESPIRATORY_TRACT | Status: DC | PRN

## 2021-07-01 MED ORDER — HEPARIN BOLUS VIA INFUSION
5000.0000 [IU] | Freq: Once | INTRAVENOUS | Status: AC
  Administered 2021-07-01: 5000 [IU] via INTRAVENOUS
  Filled 2021-07-01: qty 5000

## 2021-07-01 MED ORDER — ATORVASTATIN CALCIUM 10 MG PO TABS
10.0000 mg | ORAL_TABLET | Freq: Every day | ORAL | Status: DC
  Administered 2021-07-01 – 2021-07-03 (×2): 10 mg via ORAL
  Filled 2021-07-01 (×2): qty 1

## 2021-07-01 MED ORDER — CEFAZOLIN SODIUM-DEXTROSE 2-4 GM/100ML-% IV SOLN
INTRAVENOUS | Status: AC
  Administered 2021-07-01: 2 g
  Filled 2021-07-01: qty 100

## 2021-07-01 MED ORDER — HEPARIN (PORCINE) 25000 UT/250ML-% IV SOLN
1050.0000 [IU]/h | INTRAVENOUS | Status: DC
  Administered 2021-07-01: 1200 [IU]/h via INTRAVENOUS
  Filled 2021-07-01: qty 250

## 2021-07-01 MED ORDER — QUETIAPINE FUMARATE 200 MG PO TABS
400.0000 mg | ORAL_TABLET | Freq: Every day | ORAL | Status: DC
  Administered 2021-07-01 – 2021-07-02 (×3): 400 mg via ORAL
  Filled 2021-07-01 (×3): qty 2

## 2021-07-01 MED ORDER — ONDANSETRON HCL 4 MG/2ML IJ SOLN
4.0000 mg | Freq: Four times a day (QID) | INTRAMUSCULAR | Status: DC | PRN
  Administered 2021-07-01 – 2021-07-02 (×4): 4 mg via INTRAVENOUS
  Filled 2021-07-01 (×4): qty 2

## 2021-07-01 MED ORDER — HEPARIN (PORCINE) 25000 UT/250ML-% IV SOLN
600.0000 [IU]/h | INTRAVENOUS | Status: DC
  Administered 2021-07-01 – 2021-07-02 (×3): 600 [IU]/h via INTRAVENOUS
  Filled 2021-07-01: qty 250

## 2021-07-01 MED ORDER — ALTEPLASE 2 MG IJ SOLR
INTRAMUSCULAR | Status: DC | PRN
  Administered 2021-07-01: 4 mg

## 2021-07-01 MED ORDER — HEPARIN SODIUM (PORCINE) 1000 UNIT/ML IJ SOLN
INTRAMUSCULAR | Status: AC
  Filled 2021-07-01: qty 1

## 2021-07-01 MED ORDER — ALBUTEROL SULFATE HFA 108 (90 BASE) MCG/ACT IN AERS: 2.0000 | INHALATION_SPRAY | RESPIRATORY_TRACT | Status: DC | PRN

## 2021-07-01 MED ORDER — CEFAZOLIN SODIUM-DEXTROSE 2-4 GM/100ML-% IV SOLN
2.0000 g | Freq: Once | INTRAVENOUS | Status: AC
  Administered 2021-07-01 – 2021-07-02 (×2): 2 g via INTRAVENOUS
  Filled 2021-07-01: qty 100

## 2021-07-01 MED ORDER — SODIUM CHLORIDE 0.9 % IV SOLN
0.5000 mg/h | INTRAVENOUS | Status: DC
  Administered 2021-07-01: 0.5 mg/h
  Filled 2021-07-01 (×2): qty 10

## 2021-07-01 MED ORDER — MORPHINE SULFATE (PF) 2 MG/ML IV SOLN
2.0000 mg | INTRAVENOUS | Status: DC | PRN
Start: 2021-07-01 — End: 2021-07-03
  Administered 2021-07-01 – 2021-07-03 (×6): 2 mg via INTRAVENOUS
  Filled 2021-07-01 (×6): qty 1

## 2021-07-01 MED ORDER — LAMOTRIGINE 100 MG PO TABS
400.0000 mg | ORAL_TABLET | Freq: Every day | ORAL | Status: DC
  Administered 2021-07-01 – 2021-07-02 (×3): 400 mg via ORAL
  Filled 2021-07-01 (×2): qty 4
  Filled 2021-07-01: qty 16
  Filled 2021-07-01 (×2): qty 4

## 2021-07-01 MED ORDER — HYDROCODONE-ACETAMINOPHEN 5-325 MG PO TABS
1.0000 | ORAL_TABLET | Freq: Four times a day (QID) | ORAL | Status: DC | PRN
  Administered 2021-07-01 – 2021-07-03 (×6): 1 via ORAL
  Filled 2021-07-01 (×6): qty 1

## 2021-07-01 MED ORDER — SODIUM CHLORIDE 0.9 % IV SOLN
0.2500 mg/h | INTRAVENOUS | Status: DC
  Filled 2021-07-01: qty 10

## 2021-07-01 MED ORDER — HEPARIN SODIUM (PORCINE) 1000 UNIT/ML IJ SOLN
INTRAMUSCULAR | Status: DC | PRN
  Administered 2021-07-01: 2000 [IU] via INTRAVENOUS
  Administered 2021-07-01: 1000 [IU] via INTRAVENOUS

## 2021-07-01 MED ORDER — CLOPIDOGREL BISULFATE 75 MG PO TABS
75.0000 mg | ORAL_TABLET | Freq: Every day | ORAL | Status: DC
  Administered 2021-07-01: 75 mg via ORAL
  Filled 2021-07-01: qty 1

## 2021-07-01 MED ORDER — FENTANYL CITRATE (PF) 100 MCG/2ML IJ SOLN
INTRAMUSCULAR | Status: DC | PRN
  Administered 2021-07-01 (×4): 25 ug via INTRAVENOUS

## 2021-07-01 MED ORDER — CHLORHEXIDINE GLUCONATE CLOTH 2 % EX PADS: 6.0000 | MEDICATED_PAD | Freq: Every day | CUTANEOUS | Status: DC

## 2021-07-01 MED ORDER — FENTANYL CITRATE (PF) 100 MCG/2ML IJ SOLN
INTRAMUSCULAR | Status: AC
  Filled 2021-07-01: qty 2

## 2021-07-01 MED ORDER — SODIUM CHLORIDE 0.9 % IV SOLN: 1.0000 mg/h | INTRAVENOUS | Status: DC

## 2021-07-01 MED ORDER — IOHEXOL 350 MG/ML SOLN
100.0000 mL | Freq: Once | INTRAVENOUS | Status: AC | PRN
  Administered 2021-07-01: 100 mL via INTRAVENOUS

## 2021-07-01 MED ORDER — MIDAZOLAM HCL 5 MG/5ML IJ SOLN
INTRAMUSCULAR | Status: AC
  Filled 2021-07-01: qty 5

## 2021-07-01 MED ORDER — ASPIRIN EC 81 MG PO TBEC
81.0000 mg | DELAYED_RELEASE_TABLET | Freq: Every day | ORAL | Status: DC
  Administered 2021-07-01: 81 mg via ORAL
  Filled 2021-07-01: qty 1

## 2021-07-01 MED ORDER — NICOTINE 21 MG/24HR TD PT24
21.0000 mg | MEDICATED_PATCH | Freq: Every day | TRANSDERMAL | Status: DC
  Administered 2021-07-01 – 2021-07-03 (×3): 21 mg via TRANSDERMAL
  Filled 2021-07-01 (×3): qty 1

## 2021-07-01 SURGICAL SUPPLY — 27 items
BALLN ULTRVRSE 018 2.5X100X150 (BALLOONS) ×2
BALLN ULTRVRSE 5X300X150 (BALLOONS) ×2
CANISTER PENUMBRA ENGINE (MISCELLANEOUS) ×2
CATH BEACON 5 .035 65 RIM TIP (CATHETERS) ×2
CATH INDIGO CAT6 KIT (CATHETERS) ×2
CATH INDIGO SEP 6 (CATHETERS) ×2
CATH INFUS 135CMX30CM (CATHETERS) ×2
CATH NAVICROSS ANGLED 135CM (MICROCATHETER) ×2
CATH PIG 70CM (CATHETERS) ×2
COVER PROBE U/S 5X48 (MISCELLANEOUS) ×2
DEVICE SOLENT OMNI 120CM (CATHETERS) ×2
DEVICE TORQUE .025-.038 (MISCELLANEOUS) ×2
GLIDEWIRE ANGLED SS 035X260CM (WIRE) ×2
KIT ENCORE 26 ADVANTAGE (KITS) ×2
KIT MICROPUNCTURE NIT STIFF (SHEATH) ×2
NEEDLE ENTRY 21GA 7CM ECHOTIP (NEEDLE) ×2
PACK ANGIOGRAPHY (CUSTOM PROCEDURE TRAY) ×2
SHEATH BRITE TIP 5FRX11 (SHEATH) ×2
SHEATH PINNACLE ST 6F 45CM (SHEATH) ×2
SUT PROLENE 0 CT 1 30 (SUTURE) ×2
SYR MEDRAD MARK 7 150ML (SYRINGE) ×2
TUBING CONTRAST HIGH PRESS 72 (TUBING) ×2
VALVE COPILOT STAT (MISCELLANEOUS) ×2
WIRE G V18X300CM (WIRE) ×2
WIRE GUIDERIGHT .035X150 (WIRE) ×2 IMPLANT
WIRE MAGIC TORQUE 260C (WIRE) ×2
WIRE NITINOL .018 (WIRE) ×2

## 2021-07-01 NOTE — Progress Notes (Signed)
Patient C/O of 10 out 10 pain to back, refusing Hydrocodone at this time, neurovascular assessment to right foot as charted. Message to Perry about patient back pain, also made aware bloody drainage noted to left groin dressing. New orders received, bladder scanned patient for only 245 ml. Updated Dr.Taorima, new orders received, transported patient to CT scan. Increased bleeding noted to left groin dressing and TPA infusion alarming occluded. Dr.Taorima aware stated to apply PAD to left groin and remove sutures to cath that TPA infusing into, as this is what may be occulding catheter. After removing sutures TPA infusing.

## 2021-07-01 NOTE — ED Notes (Signed)
Dr. Royal Piedra at bedside

## 2021-07-01 NOTE — Progress Notes (Addendum)
1352 patient received from vascular lab. Right lower leg cool to touch, right foot cold. Unable to doppler pulses in right foot. Pulses present with doppler in left foot. Dr.

## 2021-07-01 NOTE — Progress Notes (Addendum)
Remains flat until return to vascular lab in the am. Right foot still cold and numb,right calf and shin area cool and right knee warm to touch. Still no pulses in right foot- Dr Benjamin Stain aware. Complained of back pain. When log rolled to change bed ,back appeared normal- no bruising or discolored ares noted.

## 2021-07-01 NOTE — ED Notes (Signed)
Pt. Informed of heparin infusion risks, will notify this RN is she experience and abnormal bleeding, bruising or mental status changes. Pt. Agrees to call for help if she needs to get up for any reason to avoid potential falls.

## 2021-07-01 NOTE — ED Notes (Signed)
Pt. Provided warm blankets, water, [place] sandwich tray, repositioned, and tv remote. Resting comfortably. NAD.

## 2021-07-01 NOTE — Consult Note (Signed)
[facility] Consultation       MRN : [id number]  Kristen Frazier is a 65 y.o. (1956/04/21) female who presents with chief complaint of  Chief Complaint  Patient presents with   Leg Pain    C/o right hip/thigh pain/numbness x several days. Pt. States she had stents placed in that leg 3 months ago. Pt. Denies any CP, SOB, dizziness.   Marland Kitchen  History of Present Illness: 65 year old female with significant peripheral arterial disease presents to the emergency room with a neuro insensate right foot.  The patient has been on heparin for several hours with no improvement.  The patient has had multiple procedures to the right lower extremity including stenting thrombectomies.  She was placed on anticoagulation including Eliquis and 3 days ago developed a numb right foot.  She was still able to walk and began to the emergency room last evening where she was seen at about 1 AM.  The emergency room physician felt that the patient had mild to moderate ischemia however this morning it is more profound.  Patient cannot really feel any of her digits and can move her toes very little.  He can flex the hip and bend the knee.  Current Facility-Administered Medications  Medication Dose Route Frequency Provider Last Rate Last Admin   [MAR Hold] acetaminophen (TYLENOL) tablet 650 mg  650 mg Oral Q6H PRN Tu, Ching T, DO       [MAR Hold] albuterol (PROVENTIL) (2.5 MG/3ML) 0.083% nebulizer solution 2.5 mg  2.5 mg Nebulization Q4H PRN Renda Rolls, RPH       [MAR Hold] aspirin EC tablet 81 mg  81 mg Oral Daily Tu, Ching T, DO   81 mg at 07/01/21 0943   [MAR Hold] atorvastatin (LIPITOR) tablet 10 mg  10 mg Oral Daily Tu, Ching T, DO   10 mg at 07/01/21 0943   [MAR Hold] clopidogrel (PLAVIX) tablet 75 mg  75 mg Oral Daily Tu, Ching T, DO   75 mg at 07/01/21 0943   heparin ADULT infusion 100 units/mL (25000 units/234m)  1,050 Units/hr Intravenous Continuous POswald Hillock RPH 10.5 mL/hr at  07/01/21 0922 1,050 Units/hr at 07/01/21 0922   [MAR Hold] HYDROcodone-acetaminophen (NORCO/VICODIN) 5-325 MG per tablet 1 tablet  1 tablet Oral Q6H PRN TIleene MusaT, DO   1 tablet at 07/01/21 0248   [MAR Hold] lamoTRIgine (LAMICTAL) tablet 400 mg  400 mg Oral QHS Tu, Ching T, DO   400 mg at 07/01/21 0247   [MAR Hold] nicotine (NICODERM CQ - dosed in mg/24 hours) patch 21 mg  21 mg Transdermal Daily Tu, Ching T, DO   21 mg at 07/01/21 0941   [MAR Hold] ondansetron (ZOFRAN) injection 4 mg  4 mg Intravenous Q6H PRN Tu, Ching T, DO       [MAR Hold] QUEtiapine (SEROQUEL) tablet 400 mg  400 mg Oral QHS Tu, Ching T, DO   400 mg at 07/01/21 0248    No past medical history on file.  Past Surgical History:  Procedure Laterality Date   APPENDECTOMY     LOWER EXTREMITY ANGIOGRAPHY Right 03/01/2021   Procedure: LOWER EXTREMITY ANGIOGRAPHY;  Surgeon: DAlgernon Huxley MD;  [facility];  Service: Cardiovascular;  Laterality: Right;   LOWER EXTREMITY ANGIOGRAPHY Right 03/01/2021   Procedure: Lower Extremity Angiography;  Surgeon: SKatha Cabal MD;  [facility];  Service: Cardiovascular;  Laterality: Right;    Social  History Social History   Tobacco Use   Smoking status: Every Day   Smokeless tobacco: Never  Substance Use Topics   Alcohol use: Never   Drug use: Never    Family History No family history on file.  Noncontributory  Allergies  Allergen Reactions   Codeine Nausea And Vomiting   Oxycodone-Acetaminophen Nausea And Vomiting   Sulfa Antibiotics Nausea And Vomiting     REVIEW OF SYSTEMS (Negative unless checked)  Constitutional: '[]'$ Weight loss  '[]'$ Fever  '[]'$ Chills Cardiac: '[]'$ Chest pain   '[]'$ Chest pressure   '[]'$ Palpitations   '[]'$ Shortness of breath when laying flat   '[]'$ Shortness of breath at rest   '[]'$ Shortness of breath with exertion. Vascular:  '[]'$ Pain in legs with walking   '[]'$ Pain in legs at rest   '[]'$ Pain in legs when laying flat   '[]'$ Claudication    '[]'$ Pain in feet when walking  '[]'$ Pain in feet at rest  '[]'$ Pain in feet when laying flat   '[]'$ History of DVT   '[]'$ Phlebitis   '[]'$ Swelling in legs   '[]'$ Varicose veins   '[]'$ Non-healing ulcers Pulmonary:   '[]'$ Uses home oxygen   '[]'$ Productive cough   '[]'$ Hemoptysis   '[]'$ Wheeze  '[]'$ COPD   '[]'$ Asthma Neurologic:  '[]'$ Dizziness  '[]'$ Blackouts   '[]'$ Seizures   '[]'$ History of stroke   '[]'$ History of TIA  '[]'$ Aphasia   '[]'$ Temporary blindness   '[]'$ Dysphagia   '[]'$ Weakness or numbness in arms   '[]'$ Weakness or numbness in legs Musculoskeletal:  '[]'$ Arthritis   '[]'$ Joint swelling   '[]'$ Joint pain   '[]'$ Low back pain Hematologic:  '[]'$ Easy bruising  '[]'$ Easy bleeding   '[]'$ Hypercoagulable state   '[]'$ Anemic  '[]'$ Hepatitis Gastrointestinal:  '[]'$ Blood in stool   '[]'$ Vomiting blood  '[]'$ Gastroesophageal reflux/heartburn   '[]'$ Difficulty swallowing. Genitourinary:  '[]'$ Chronic kidney disease   '[]'$ Difficult urination  '[]'$ Frequent urination  '[]'$ Burning with urination   '[]'$ Blood in urine Skin:  '[]'$ Rashes   '[]'$ Ulcers   '[]'$ Wounds Psychological:  '[]'$ History of anxiety   '[]'$  History of major depression.  Physical Examination  Vitals:   07/01/21 0700 07/01/21 0800 07/01/21 0900 07/01/21 0930  BP: (!) 111/57 (!) 112/51 (!) 118/56 135/63  Pulse: 65 65 (!) 58 70  Resp: '16 16 15   '$ Temp:      TempSrc:      SpO2: 96% 97% 98% 98%  Weight:      Height:       Body mass index is 28.84 kg/m.  Head: [place]/AT Ear/Nose/Throat: Hearing grossly intact, nares w/o erythema or drainage, oropharynx w/o Erythema/Exudate Eyes: PERRLA, EOMI.  Neck: Supple, no nuchal rigidity.  No JVD.  Pulmonary:  Good air movement, clear to auscultation bilaterally.  Cardiac: RRR, normal S1, S2, no Murmurs, rubs or gallops.  Vascular: Bilateral femoral pulses no palpable pulses below that level   Gastrointestinal: soft, non-tender/non-distended. No guarding/reflex. No masses, surgical incisions, or scars. Musculoskeletal: M/S 5/5 throughout.  Extremities without ischemic changes.  No deformity or atrophy. No  edema. Neurologic: CN 2-12 intact. Pain and light touch intact in extremities.  Symmetrical.  Speech is fluent. Motor exam as listed above. Psychiatric: Judgment intact, Mood & affect appropriate for pt's clinical situation. Dermatologic: No rashes or ulcers noted.  No cellulitis or open wounds. Lymph : No Cervical, Axillary, or Inguinal lymphadenopathy.  Diagnostic Studies   CBC Lab Results  Component Value Date   WBC 8.1 07/01/2021   HGB 14.2 07/01/2021   HCT 42.5 07/01/2021   MCV 94.7 07/01/2021   PLT 316 07/01/2021    BMET    Component Value  Date/Time   NA 138 06/30/2021 1934   K 4.0 06/30/2021 1934   CL 106 06/30/2021 1934   CO2 25 06/30/2021 1934   GLUCOSE 97 06/30/2021 1934   BUN 7 (L) 06/30/2021 1934   CREATININE 0.89 06/30/2021 1934   CALCIUM 9.2 06/30/2021 1934   GFRNONAA >60 06/30/2021 1934   Estimated Creatinine Clearance: 62.1 mL/min (by C-G formula based on SCr of 0.89 mg/dL).  COAG Lab Results  Component Value Date   INR 1.0 07/01/2021   INR 1.0 03/01/2021    Radiology CT scan reveals a patent aortoiliac segments with a patent right common and profunda femoris arteries and occlusion of the mid superficial femoral artery.  She does reconstituted posterior tibial artery in the proximal calf.  Assessment/Plan 65 year old female with an ischemic right lower extremity is brought to the Cath Lab for definitive diagnosis and possible thrombectomy or thrombolytic therapy to help reestablish flow however the patient may need a femoral to tibial bypass for limb salvage next week.  Risk and benefits discussed in detail with the patient   Shaune Spittle, MD Vascular and Endovascular Surgery  07/01/2021 10:51 AM

## 2021-07-01 NOTE — H&P (View-Only) (Signed)
[facility] Consultation       MRN : [id number]  Kristen Frazier is a 65 y.o. (10-07-1956) female who presents with chief complaint of  Chief Complaint  Patient presents with   Leg Pain    C/o right hip/thigh pain/numbness x several days. Pt. States she had stents placed in that leg 3 months ago. Pt. Denies any CP, SOB, dizziness.   Marland Kitchen  History of Present Illness: 65 year old female with significant peripheral arterial disease presents to the emergency room with a neuro insensate right foot.  The patient has been on heparin for several hours with no improvement.  The patient has had multiple procedures to the right lower extremity including stenting thrombectomies.  She was placed on anticoagulation including Eliquis and 3 days ago developed a numb right foot.  She was still able to walk and began to the emergency room last evening where she was seen at about 1 AM.  The emergency room physician felt that the patient had mild to moderate ischemia however this morning it is more profound.  Patient cannot really feel any of her digits and can move her toes very little.  He can flex the hip and bend the knee.  Current Facility-Administered Medications  Medication Dose Route Frequency Provider Last Rate Last Admin   [MAR Hold] acetaminophen (TYLENOL) tablet 650 mg  650 mg Oral Q6H PRN Tu, Ching T, DO       [MAR Hold] albuterol (PROVENTIL) (2.5 MG/3ML) 0.083% nebulizer solution 2.5 mg  2.5 mg Nebulization Q4H PRN Renda Rolls, RPH       [MAR Hold] aspirin EC tablet 81 mg  81 mg Oral Daily Tu, Ching T, DO   81 mg at 07/01/21 0943   [MAR Hold] atorvastatin (LIPITOR) tablet 10 mg  10 mg Oral Daily Tu, Ching T, DO   10 mg at 07/01/21 0943   [MAR Hold] clopidogrel (PLAVIX) tablet 75 mg  75 mg Oral Daily Tu, Ching T, DO   75 mg at 07/01/21 0943   heparin ADULT infusion 100 units/mL (25000 units/279m)  1,050 Units/hr Intravenous Continuous POswald Hillock RPH 10.5 mL/hr at  07/01/21 0922 1,050 Units/hr at 07/01/21 0922   [MAR Hold] HYDROcodone-acetaminophen (NORCO/VICODIN) 5-325 MG per tablet 1 tablet  1 tablet Oral Q6H PRN TIleene MusaT, DO   1 tablet at 07/01/21 0248   [MAR Hold] lamoTRIgine (LAMICTAL) tablet 400 mg  400 mg Oral QHS Tu, Ching T, DO   400 mg at 07/01/21 0247   [MAR Hold] nicotine (NICODERM CQ - dosed in mg/24 hours) patch 21 mg  21 mg Transdermal Daily Tu, Ching T, DO   21 mg at 07/01/21 0941   [MAR Hold] ondansetron (ZOFRAN) injection 4 mg  4 mg Intravenous Q6H PRN Tu, Ching T, DO       [MAR Hold] QUEtiapine (SEROQUEL) tablet 400 mg  400 mg Oral QHS Tu, Ching T, DO   400 mg at 07/01/21 0248    No past medical history on file.  Past Surgical History:  Procedure Laterality Date   APPENDECTOMY     LOWER EXTREMITY ANGIOGRAPHY Right 03/01/2021   Procedure: LOWER EXTREMITY ANGIOGRAPHY;  Surgeon: DAlgernon Huxley MD;  [facility];  Service: Cardiovascular;  Laterality: Right;   LOWER EXTREMITY ANGIOGRAPHY Right 03/01/2021   Procedure: Lower Extremity Angiography;  Surgeon: SKatha Cabal MD;  [facility];  Service: Cardiovascular;  Laterality: Right;    Social  History Social History   Tobacco Use   Smoking status: Every Day   Smokeless tobacco: Never  Substance Use Topics   Alcohol use: Never   Drug use: Never    Family History No family history on file.  Noncontributory  Allergies  Allergen Reactions   Codeine Nausea And Vomiting   Oxycodone-Acetaminophen Nausea And Vomiting   Sulfa Antibiotics Nausea And Vomiting     REVIEW OF SYSTEMS (Negative unless checked)  Constitutional: '[]'$ Weight loss  '[]'$ Fever  '[]'$ Chills Cardiac: '[]'$ Chest pain   '[]'$ Chest pressure   '[]'$ Palpitations   '[]'$ Shortness of breath when laying flat   '[]'$ Shortness of breath at rest   '[]'$ Shortness of breath with exertion. Vascular:  '[]'$ Pain in legs with walking   '[]'$ Pain in legs at rest   '[]'$ Pain in legs when laying flat   '[]'$ Claudication    '[]'$ Pain in feet when walking  '[]'$ Pain in feet at rest  '[]'$ Pain in feet when laying flat   '[]'$ History of DVT   '[]'$ Phlebitis   '[]'$ Swelling in legs   '[]'$ Varicose veins   '[]'$ Non-healing ulcers Pulmonary:   '[]'$ Uses home oxygen   '[]'$ Productive cough   '[]'$ Hemoptysis   '[]'$ Wheeze  '[]'$ COPD   '[]'$ Asthma Neurologic:  '[]'$ Dizziness  '[]'$ Blackouts   '[]'$ Seizures   '[]'$ History of stroke   '[]'$ History of TIA  '[]'$ Aphasia   '[]'$ Temporary blindness   '[]'$ Dysphagia   '[]'$ Weakness or numbness in arms   '[]'$ Weakness or numbness in legs Musculoskeletal:  '[]'$ Arthritis   '[]'$ Joint swelling   '[]'$ Joint pain   '[]'$ Low back pain Hematologic:  '[]'$ Easy bruising  '[]'$ Easy bleeding   '[]'$ Hypercoagulable state   '[]'$ Anemic  '[]'$ Hepatitis Gastrointestinal:  '[]'$ Blood in stool   '[]'$ Vomiting blood  '[]'$ Gastroesophageal reflux/heartburn   '[]'$ Difficulty swallowing. Genitourinary:  '[]'$ Chronic kidney disease   '[]'$ Difficult urination  '[]'$ Frequent urination  '[]'$ Burning with urination   '[]'$ Blood in urine Skin:  '[]'$ Rashes   '[]'$ Ulcers   '[]'$ Wounds Psychological:  '[]'$ History of anxiety   '[]'$  History of major depression.  Physical Examination  Vitals:   07/01/21 0700 07/01/21 0800 07/01/21 0900 07/01/21 0930  BP: (!) 111/57 (!) 112/51 (!) 118/56 135/63  Pulse: 65 65 (!) 58 70  Resp: '16 16 15   '$ Temp:      TempSrc:      SpO2: 96% 97% 98% 98%  Weight:      Height:       Body mass index is 28.84 kg/m.  Head: [place]/AT Ear/Nose/Throat: Hearing grossly intact, nares w/o erythema or drainage, oropharynx w/o Erythema/Exudate Eyes: PERRLA, EOMI.  Neck: Supple, no nuchal rigidity.  No JVD.  Pulmonary:  Good air movement, clear to auscultation bilaterally.  Cardiac: RRR, normal S1, S2, no Murmurs, rubs or gallops.  Vascular: Bilateral femoral pulses no palpable pulses below that level   Gastrointestinal: soft, non-tender/non-distended. No guarding/reflex. No masses, surgical incisions, or scars. Musculoskeletal: M/S 5/5 throughout.  Extremities without ischemic changes.  No deformity or atrophy. No  edema. Neurologic: CN 2-12 intact. Pain and light touch intact in extremities.  Symmetrical.  Speech is fluent. Motor exam as listed above. Psychiatric: Judgment intact, Mood & affect appropriate for pt's clinical situation. Dermatologic: No rashes or ulcers noted.  No cellulitis or open wounds. Lymph : No Cervical, Axillary, or Inguinal lymphadenopathy.  Diagnostic Studies   CBC Lab Results  Component Value Date   WBC 8.1 07/01/2021   HGB 14.2 07/01/2021   HCT 42.5 07/01/2021   MCV 94.7 07/01/2021   PLT 316 07/01/2021    BMET    Component Value  Date/Time   NA 138 06/30/2021 1934   K 4.0 06/30/2021 1934   CL 106 06/30/2021 1934   CO2 25 06/30/2021 1934   GLUCOSE 97 06/30/2021 1934   BUN 7 (L) 06/30/2021 1934   CREATININE 0.89 06/30/2021 1934   CALCIUM 9.2 06/30/2021 1934   GFRNONAA >60 06/30/2021 1934   Estimated Creatinine Clearance: 62.1 mL/min (by C-G formula based on SCr of 0.89 mg/dL).  COAG Lab Results  Component Value Date   INR 1.0 07/01/2021   INR 1.0 03/01/2021    Radiology CT scan reveals a patent aortoiliac segments with a patent right common and profunda femoris arteries and occlusion of the mid superficial femoral artery.  She does reconstituted posterior tibial artery in the proximal calf.  Assessment/Plan 65 year old female with an ischemic right lower extremity is brought to the Cath Lab for definitive diagnosis and possible thrombectomy or thrombolytic therapy to help reestablish flow however the patient may need a femoral to tibial bypass for limb salvage next week.  Risk and benefits discussed in detail with the patient   Shaune Spittle, MD Vascular and Endovascular Surgery  07/01/2021 10:51 AM

## 2021-07-01 NOTE — Progress Notes (Signed)
PROGRESS NOTE  Kristen Frazier  DOB: [birth year]  PCP: Ulyess Blossom, [place] [id number]  DOA: 06/30/2021  LOS: 0 days  Hospital Day: 2   Chief Complaint  Patient presents with   Leg Pain    C/o right hip/thigh pain/numbness x several days. Pt. States she had stents placed in that leg 3 months ago. Pt. Denies any CP, SOB, dizziness.     Brief narrative: Kristen Frazier is a 65 y.o. female with PMH significant for bipolar disorder, chronic smoking, HLD, PAD s/p extensive right lower extremity revascularization with stents on Eliquis. Patient presented to the ED on 8/13 with concerns of worsening right lower extremity pain for 3 days. In April 2022, patient underwent extensive right lower extremity revascularization and had early rethrombosis requiring takeback to angiography where thrombectomy was performed.  She was then started on full anticoagulation with Eliquis.  She continues to smoke multiple packs per day.   In the ED patient had a temperature 99.1, blood pressure elevated to 144/60 Labs mostly unremarkable CT angio of abdominal aorta with iliofemoral runoff showed occluded right superficial femoral and popliteal artery stents. ED physician discussed with vascular surgery who recommended starting her on heparin infusion. Admitted to hospitalist service,  Subjective: Patient was seen and examined this morning.  Elderly Caucasian female.  Lying on bed.  Not in distress.  Pain controlled.  Vascular surgeon was at the bedside.  Noted the plan of surgical intervention today.  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 -Seen by vascular surgery.  Patient underwent aortogram and thrombectomy, tPA infusion and angioplasty of the  involved arteries. -Continue aspirin and Plavix and heparin drip.   Hyperlipidemia -Continue statin   bipolar disorder -Continue Seroquel, Lamictal   Ongoing tobacco abuse -Smokes multiple packs of cigarettes a day.  Stressed the importance of cessation.  Nicotine patch offered.   Mobility: Post procedure PT eval Code Status:   Code Status: Full Code  Nutritional status: Body mass index is 28.84 kg/m.     Diet: Cardiac diet once allowed by surgery team Diet Order             Diet NPO time specified Except for: Sips with Meds  Diet effective now                  DVT prophylaxis: Heparin drip    Antimicrobials: None Fluid: NS at 75 Consultants: Vascular surgery Family Communication: None at bedside  Status is: Inpatient  Remains inpatient appropriate because: POD 0, heparin drip  Dispo: The patient is from: Home              Anticipated d/c is to: Home in 2 to 3 days              Patient currently is not medically stable to d/c.   Difficult to place patient No     Infusions:   sodium chloride 75 mL/hr at 07/01/21 1325   alteplase (LIMB ISCHEMIA) 10 mg in normal saline (0.02 mg/mL) infusion 1 mg/hr (07/01/21 1352)   Followed by   alteplase (LIMB ISCHEMIA) 10 mg in normal saline (0.02 mg/mL) infusion     clindamycin     heparin 600 Units/hr (07/01/21 1327)    Scheduled Meds:  atorvastatin  10 mg Oral  Daily   [START ON 07/02/2021] Chlorhexidine Gluconate Cloth  6 each Topical Q0600   fentaNYL       heparin sodium (porcine)       lamoTRIgine  400 mg Oral QHS   midazolam       nicotine  21 mg Transdermal Daily   QUEtiapine  400 mg Oral QHS    Antimicrobials: Anti-infectives (From admission, onward)    Start     Dose/Rate Route Frequency Ordered Stop   07/01/21 1301  ceFAZolin (ANCEF) 2-4 GM/100ML-% IVPB       Note to Pharmacy: Fransico Michael   : cabinet override      07/01/21 1301 07/01/21 1316   07/01/21 1050  clindamycin (CLEOCIN) 300 MG/50ML IVPB        Note to Pharmacy: Fransico Michael   : cabinet override      07/01/21 1050 07/01/21 2259       PRN meds: acetaminophen, albuterol, HYDROcodone-acetaminophen, ondansetron (ZOFRAN) IV   Objective: Vitals:   07/01/21 1330 07/01/21 1352  BP: (!) 160/57 (!) 152/55  Pulse: 70 67  Resp: 18   Temp:    SpO2: 93% 96%   No intake or output data in the 24 hours ending 07/01/21 1354 Filed Weights   06/30/21 1915  Weight: 76.2 kg   Weight change:  Body mass index is 28.84 kg/m.   Physical Exam: General exam: Pleasant, elderly Caucasian female.  Pain controlled Skin: No rashes, lesions or ulcers. HEENT: Atraumatic, normocephalic, no obvious bleeding Lungs: Clear to auscultation bilaterally CVS: Regular rate and rhythm, no murmur GI/Abd soft, nontender, nondistended, bowel sound present CNS: Alert, awake abound x3 Psychiatry: Anxious Extremities: No pedal edema, no calf tenderness, right lower extremity cool to touch, feeble pulse  Data Review: I have personally reviewed the laboratory data and studies available.  Recent Labs  Lab 06/30/21 1934 07/01/21 0618  WBC 9.8 8.1  NEUTROABS 6.5  --   HGB 15.2* 14.2  HCT 44.5 42.5  MCV 96.1 94.7  PLT 371 316   Recent Labs  Lab 06/30/21 1934  NA 138  K 4.0  CL 106  CO2 25  GLUCOSE 97  BUN 7*  CREATININE 0.89  CALCIUM 9.2    F/u labs ordered Unresulted Labs (From admission, onward)     Start     Ordered   07/02/21 0500  Heparin level (unfractionated)  Tomorrow morning,   STAT        07/01/21 0907   07/02/21 0500  CBC  Tomorrow morning,   STAT        07/01/21 0907   07/01/21 1600  APTT  Once-Timed,   STAT        07/01/21 0916   07/01/21 1347  MRSA Next Gen by PCR, Nasal  Once,   R        07/01/21 1346            Signed, Terrilee Croak, MD [facility] 07/01/2021

## 2021-07-01 NOTE — ED Provider Notes (Signed)
[facility] Emergency Department Provider Note  ____________________________________________  Time seen: Approximately 1:12 AM  I have reviewed the triage vital signs and the nursing notes.   HISTORY  Chief Complaint Leg Pain (C/o right hip/thigh pain/numbness x several days. Pt. States she had stents placed in that leg 3 months ago. Pt. Denies any CP, SOB, dizziness. )    HPI Kristen Frazier is a 65 y.o. female with a history of PAD, smoking who comes ED complaining of right lower leg pain and paresthesia that started 3 days ago, constant, no aggravating or alleviating factors.  Radiates down to the foot.  Moderate intensity.  No fevers chills chest pain or shortness of breath.    No past medical history on file.   Patient Active Problem List   Diagnosis Date Noted   Arterial stent thrombosis, initial encounter ([place]) 07/01/2021   Limb ischemia 03/01/2021   Atherosclerosis of native arteries of the extremities with ulceration ([place]) 02/20/2021   Tobacco use 07/14/2017   Bipolar affective disorder ([place]) 07/17/2013   Colitis 07/17/2013     Past Surgical History:  Procedure Laterality Date   APPENDECTOMY     LOWER EXTREMITY ANGIOGRAPHY Right 03/01/2021   Procedure: LOWER EXTREMITY ANGIOGRAPHY;  Surgeon: Algernon Huxley, MD;  Location: [facility];  Service: Cardiovascular;  Laterality: Right;   LOWER EXTREMITY ANGIOGRAPHY Right 03/01/2021   Procedure: Lower Extremity Angiography;  Surgeon: Katha Cabal, MD;  Location: [facility];  Service: Cardiovascular;  Laterality: Right;     Prior to Admission medications   Medication Sig Start Date End Date Taking? Authorizing Provider  albuterol (VENTOLIN HFA) 108 (90 Base) MCG/ACT inhaler Inhale 2 puffs into the lungs every 4 (four) hours as needed for wheezing or shortness of breath. 06/23/19  Yes [provider]  apixaban (ELIQUIS) 2.5 MG TABS tablet Take 1 tablet (2.5 mg total) by  mouth 2 (two) times daily. 03/03/21  Yes Schnier, Dolores Lory, MD  aspirin EC 81 MG EC tablet Take 1 tablet (81 mg total) by mouth daily. Swallow whole. 03/03/21  Yes Schnier, Dolores Lory, MD  atorvastatin (LIPITOR) 10 MG tablet Take 1 tablet (10 mg total) by mouth daily. 03/01/21 03/01/22 Yes Dew, Erskine Squibb, MD  clopidogrel (PLAVIX) 75 MG tablet Take 1 tablet (75 mg total) by mouth daily. 03/01/21  Yes Dew, Erskine Squibb, MD  lamoTRIgine (LAMICTAL) 200 MG tablet Take 400 mg by mouth at bedtime. 10/27/20  Yes [provider]  QUEtiapine (SEROQUEL) 200 MG tablet Take 400 mg by mouth at bedtime. 02/11/21  Yes [provider]     Allergies Codeine, Oxycodone-acetaminophen, and Sulfa antibiotics   No family history on file.  Social History Social History   Tobacco Use   Smoking status: Every Day   Smokeless tobacco: Never  Substance Use Topics   Alcohol use: Never   Drug use: Never    Review of Systems  Constitutional:   No fever or chills.  ENT:   No sore throat. No rhinorrhea. Cardiovascular:   No chest pain or syncope. Respiratory:   No dyspnea or cough. Gastrointestinal:   Negative for abdominal pain, vomiting and diarrhea.  Musculoskeletal:   Right leg pain as above All other systems reviewed and are negative except as documented above in ROS and HPI.  ____________________________________________   PHYSICAL EXAM:  VITAL SIGNS: ED Triage Vitals  Enc Vitals Group     BP 06/30/21 1913 (!) 144/60  Pulse Rate 06/30/21 1913 85     Resp 06/30/21 1913 18     Temp 06/30/21 1913 99.1 F (37.3 C)     Temp Source 06/30/21 1913 Oral     SpO2 06/30/21 1913 97 %     Weight 06/30/21 1915 168 lb (76.2 kg)     Height 06/30/21 1915 '5\' 4"'$  (1.626 m)     Head Circumference --      Peak Flow --      Pain Score 06/30/21 1914 6     Pain Loc --      Pain Edu? --      Excl. in [place]? --     Vital signs reviewed, nursing assessments reviewed.   Constitutional:   Alert and  oriented. Non-toxic appearance. Eyes:   Conjunctivae are normal. EOMI. PERRL. ENT      Head:   Normocephalic and atraumatic.      Nose:   Wearing a mask.      Mouth/Throat:   Wearing a mask.      Neck:   No meningismus. Full ROM. Hematological/Lymphatic/Immunilogical:   No cervical lymphadenopathy. Cardiovascular:   RRR.  Right dorsalis pedis pulse is thready compared to normal pulse on the left foot.  Capillary refill is present but sluggish on the right foot, normal on the left..  Right foot is warm, no pallor Respiratory:   Normal respiratory effort without tachypnea/retractions. Breath sounds are clear and equal bilaterally. No wheezes/rales/rhonchi. Gastrointestinal:   Soft and nontender. Non distended. There is no CVA tenderness.  No rebound, rigidity, or guarding.  Musculoskeletal:   Normal range of motion in all extremities. No joint effusions.  No lower extremity tenderness.  No edema. Neurologic:   Normal speech and language.  Motor grossly intact. No acute focal neurologic deficits are appreciated.  Skin:    Skin is warm, dry and intact. No rash noted.  No petechiae, purpura, or bullae.  ____________________________________________    LABS (pertinent positives/negatives) (all labs ordered are listed, but only abnormal results are displayed) Labs Reviewed  BASIC METABOLIC PANEL - Abnormal; Notable for the following components:      Result Value   BUN 7 (*)    All other components within normal limits  CBC WITH DIFFERENTIAL/PLATELET - Abnormal; Notable for the following components:   Hemoglobin 15.2 (*)    All other components within normal limits  HEPARIN LEVEL (UNFRACTIONATED)  APTT  PROTIME-INR   ____________________________________________   EKG    ____________________________________________    RADIOLOGY  CT ANGIO AO+BIFEM W & OR WO CONTRAST  Result Date: 06/30/2021 CLINICAL DATA:  Right hip and thigh pain for several days, history of prior vascular  stent placement EXAM: CT ANGIOGRAPHY OF ABDOMINAL AORTA WITH ILIOFEMORAL RUNOFF TECHNIQUE: Multidetector CT imaging of the abdomen, pelvis and lower extremities was performed using the standard protocol during bolus administration of intravenous contrast. Multiplanar CT image reconstructions and MIPs were obtained to evaluate the vascular anatomy. CONTRAST:  15m OMNIPAQUE IOHEXOL 350 MG/ML SOLN COMPARISON:  Angiography from 03/01/2021 FINDINGS: VASCULAR Aorta: Atherosclerotic calcifications of the abdominal aorta are noted. No aneurysmal dilatation is seen. 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 SMA: Patent without evidence of aneurysm, dissection, vasculitis or significant stenosis. Renals: Mild atherosclerotic changes are noted without focal stenosis. Single renal arteries are seen bilaterally. IMA: Patent without evidence of aneurysm, dissection, vasculitis or significant stenosis. RIGHT Lower Extremity Inflow: Iliac artery stent is noted in the common iliac artery extending  into the proximal aspect of the external iliac artery. Internal and external iliac artery are patent. Common femoral artery demonstrates some calcification. Runoff: The superficial femoral artery is somewhat diminutive with occlusion approximately 10 cm from the origin of the superficial femoral artery. The native superficial femoral artery is thrombosed for a short segment prior to visualization of thrombosed superficial femoral artery and popliteal artery stent. Below the popliteal arterial stent, there is reconstitution of the posterior tibial and peroneal artery. Anterior tibial artery is not well visualized. Posterior tibial artery continues into the foot. LEFT Lower Extremity Inflow: Common iliac artery on the left demonstrates focal stent which is widely patent. The external and internal iliac artery are patent. Common femoral artery demonstrates some calcification. Runoff: The superficial  femoral artery on the left is diminutive as well but larger than that seen on the right with heavy vascular calcification identified. It occludes just above the level of the adductor canal however reconstitution via muscular collaterals of the popliteal artery is seen. Popliteal artery shows patent trifurcation with three-vessel runoff to the ankle and the anterior and posterior tibial arteries extending into the foot. Veins: No specific venous abnormality is noted. Review of the MIP images confirms the above findings. NON-VASCULAR Lower chest: Lung bases are free of acute infiltrate or sizable effusion. Hepatobiliary: Few scattered calcifications are noted within the liver. The gallbladder is within normal limits. Pancreas: Unremarkable. No pancreatic ductal dilatation or surrounding inflammatory changes. Spleen: Normal in size without focal abnormality. Adrenals/Urinary Tract: Adrenal glands are unremarkable. Kidneys show no renal calculi or obstructive changes. The bladder is well distended. Stomach/Bowel: Fecal material is noted throughout the colon consistent with a mild degree of constipation although no obstructive changes are seen. The appendix is not well visualized consistent with prior surgical history. Small bowel and stomach are within normal limits. Lymphatic: No sizable lymphadenopathy is noted. Reproductive: Uterus and bilateral adnexa are unremarkable. Other: No abdominal wall hernia or abnormality. No abdominopelvic ascites. Musculoskeletal: Degenerative changes of lumbar spine are noted. No acute bony abnormality is seen. IMPRESSION: VASCULAR Patent arterial stents within the common iliac arteries bilaterally. Occluded right superficial femoral and popliteal arteries stent with occlusion of the native superficial femoral artery approximately 10 cm beyond its origin. There is reconstitution via muscular collaterals of the peroneal and posterior tibial artery as described. On the left, the  superficial femoral artery is diminutive and shows occlusion at the level of the adductor canal. The degree of occlusion is short in segment measuring approximately 2.4 cm with significant reconstitution via muscular collaterals. Three-vessel runoff to the left ankle is noted. NON-VASCULAR Changes suggestive of mild colonic constipation. No other significant nonvascular abnormality is noted. Electronically Signed   By: Inez Catalina M.D.   On: 06/30/2021 21:20    ____________________________________________   PROCEDURES .Critical Care  Date/Time: 07/01/2021 1:16 AM Performed by: Carrie Mew, MD Authorized by: Carrie Mew, MD   Critical care provider statement:    Critical care time (minutes):  35   Critical care time was exclusive of:  Separately billable procedures and treating other patients   Critical care was necessary to treat or prevent imminent or life-threatening deterioration of the following conditions:  Circulatory failure   Critical care was time spent personally by me on the following activities:  Development of treatment plan with patient or surrogate, discussions with consultants, evaluation of patient's response to treatment, examination of patient, obtaining history from patient or surrogate, ordering and performing treatments and interventions, ordering and review  of laboratory studies, ordering and review of radiographic studies, pulse oximetry, re-evaluation of patient's condition and review of old charts  ____________________________________________  DIFFERENTIAL DIAGNOSIS   Neuropathic pain, arterial occlusion.  Doubt infection or DVT  CLINICAL IMPRESSION / ASSESSMENT AND PLAN / ED COURSE  Medications ordered in the ED: Medications  heparin bolus via infusion 5,000 Units (5,000 Units Intravenous Bolus from Bag 07/01/21 0015)    Followed by  heparin ADULT infusion 100 units/mL (25000 units/237m) (1,200 Units/hr Intravenous New Bag/Given 07/01/21 0014)   nicotine (NICODERM CQ - dosed in mg/24 hours) patch 21 mg (has no administration in time range)  acetaminophen (TYLENOL) tablet 650 mg (has no administration in time range)  HYDROcodone-acetaminophen (NORCO/VICODIN) 5-325 MG per tablet 1 tablet (has no administration in time range)  ondansetron (ZOFRAN) injection 4 mg (has no administration in time range)  iohexol (OMNIPAQUE) 350 MG/ML injection 100 mL (100 mLs Intravenous Contrast Given 06/30/21 2029)    Pertinent labs & imaging results that were available during my care of the patient were reviewed by me and considered in my medical decision making (see chart for details).  LRICKIA VERNONwas evaluated in Emergency Department on 07/01/2021 for the symptoms described in the history of present illness. She was evaluated in the context of the global COVID-19 pandemic, which necessitated consideration that the patient might be at risk for infection with the SARS-CoV-2 virus that causes COVID-19. Institutional protocols and algorithms that pertain to the evaluation of patients at risk for COVID-19 are in a state of rapid change based on information released by regulatory bodies including the CDC and federal and state organizations. These policies and algorithms were followed during the patient's care in the ED.   Patient presents with right leg pain after vascular procedures for angioplasty and stenting in April 2022.  Patient reports compliance with her antiplatelets and Eliquis but ongoing smoking.  CT angiogram demonstrates thrombosis of her right SFA and right popliteal stents.  Discussed with vascular surgery Dr. TBenjamin Stainwho recommends hospitalization, heparin for preparation for vascular treatment      ____________________________________________   FINAL CLINICAL IMPRESSION(S) / ED DIAGNOSES    Final diagnoses:  Right leg pain  Arterial occlusion, lower extremity ([facility]     ED Discharge Orders     None       Portions of this  note were generated with dragon dictation software. Dictation errors may occur despite best attempts at proofreading.    SCarrie Mew MD 07/01/21 [phone]

## 2021-07-01 NOTE — Op Note (Signed)
[facility]  Percutaneous Study/Intervention Procedural Note     Date of Surgery: 07/01/2021,1:21 PM  Surgeon: Shaune Spittle, MD  Pre-operative Diagnosis: Ischemic right lower extremity with neurosensory deficit  Post-operative diagnosis:  Same  Procedure(s) Performed:  1.  Duplex guided access to the left common femoral artery  2.  Aortogram with right lower extremity runoff  3.  Selective catheterization for second third and fourth order with catheter taken to the posterior tibial artery angiogram obtained  4.  Penumbra thrombectomy of the superficial femoral popliteal and posterior tibial arteries  5.  tPA infusion into the superficial femoral artery popliteal artery and tibial vessels  6.  Angioplasty of the superficial femoral artery and popliteal artery with a 5 x 300 angioplasty balloon  7.  Angioplasty of the popliteal and posterior tibial artery with a 2-1/2 x 150 angioplasty balloon  Anesthesia: Conscious sedation was administered by the interventional radiology RN under my direct supervision. IV Versed plus fentanyl were utilized. Continuous ECG, pulse oximetry and blood pressure was monitored throughout the entire procedure. Conscious sedation was administered for a total of 2 hours and 16 minutes minutes.  Sheath: 6 French 45 cm Pinnacle destination  Contrast: 65 cc   Fluoroscopy Time: 25.8 minutes  Indications: 65 year old female with significant peripheral arterial disease who under went numerous right lower extremity interventions in the past now presents the emergency room with an acute right lower extremity ischemia.  Patient has fairly significant profound ischemia with confirmation of occlusion of the superficial femoral artery and popliteal artery and reconstitution of the posterior tibial artery distally that was patent to the foot.  The patient is brought to the Cath Lab at this point in time to try to reestablish flow.  Patient may  require a distal bypass for limb salvage.  Procedure:  Kristen Frazier a 65 y.o. female who was identified and appropriate procedural time out was performed.  The patient was then placed supine on the table and prepped and draped in the usual sterile fashion.  Ultrasound was used to evaluate the left common femoral artery.  It was echolucent and pulsatile indicating it is patent .  An ultrasound image was acquired for the permanent record.  A micropuncture needle was used to access the left common femoral artery under direct ultrasound guidance.  The microwire was then advanced under fluoroscopic guidance without difficulty followed by the micro-sheath.  A 0.035 J wire was advanced without resistance and a 5Fr sheath was placed.    Aortogram was then obtained.  The catheter was then moved to the bifurcation we went up and over the bifurcation and placed a catheter at the right common femoral artery and a right lower extremity runoff was obtained.  The patient was noted to have patent common femoral and profunda femoris arteries with complete occlusion of the mid and distal superficial femoral artery as well as occlusion of the popliteal artery.  Patient has well had significant stenosis of the proximal portion of the superficial femoral artery with that was probably 90%.  Patient did reconstitute tibials via collaterals.  Utilizing selective techniques we went up and over and were able to get a 6 French 45 cm Pinnacle destination sheath in the contralateral common femoral artery.  We used a Glidewire and Ferd Hibbs cross to cross the lesion and we easily placed the Glidewire down to the posterior tibial artery.  We saw that there was significant thrombus so we used tPA.  4 mg was given  through an infusion catheter.  This helped significantly but there was still residual thrombus therefore we used a 6 penumbra and aspirated the clot from superficial femoral artery as well as the popliteal artery and the proximal  posterior tibial arteries.  Completion study showed fairly good resolution of a lot of the thrombus so we decided to angioplasty the inflow disease because there was a significant proximal stenosis in the midportion of the superficial femoral artery.  We angioplastied the previous stented segments as well as the superficial femoral artery all the way through the origin with a 5 x 300 angioplasty balloon.  Completion study showed most of the thrombus ended up to tibioperoneal trunk so we used the penumbra to aspirate most of that.  We were able to reestablish flow into the posterior tibial artery.  We angioplastied the popliteal and posterior tibial artery with a 2-1/2 x 150 angioplasty balloon and we are able to get sluggish but better flow into the proximal portion of the posterior posterior tibial artery.  There is significant spasm in the distal posterior tibial artery and may be a little bit of thrombus therefore we felt that with a residual thrombus and a borderline result we would place an infusion catheter which was placed from the posterior tibial artery all the way up to the midportion of the superficial femoral artery.  If indeed this works the proximal superficial femoral artery may need to be stented.  Patient had a warm leg all the way down to the mid calf where the foot was very cool.  Patient still had the neurosensory deficit however were hoping of the tPA opens up all the collaterals at this point in time.  If this does not work and the patient will require distal bypass for limb salvage.  Should be noted this is a third attempt on this lower extremity and the patient is at high risk for limb loss.  Findings:   Aortogram: Normal aorta gram with good inflow and outflow.  Patent right common iliac artery stent  Right Lower Extremity: Common femoral superficial femoral profunda femoris arteries were patent proximally however there was a 90% stenosis of the proximal portion of the superficial  femoral artery and a complete occlusion of the distal superficial femoral artery stent all the way through the popliteal and posterior tibial arteries  Left Lower Extremity: Not visualized    Disposition: Patient was taken to the recovery room in stable condition having tolerated the procedure well.  Shaune Spittle 07/01/2021,1:21 PM

## 2021-07-01 NOTE — ED Notes (Signed)
Vascular surgeon and hospitalist at bedside. Pt able to move R foot with limited motion and sensation compared to L. Pt advised to stop smoking. Vascular surgeon told this nurse to give PO plavix and aspirin but hold any Eliquis. Pt is to be kept NPO in case of vascular procedure today.

## 2021-07-01 NOTE — Progress Notes (Signed)
ANTICOAGULATION CONSULT NOTE  Pharmacy Consult for heparin infusion Indication: VTE Treatment  Allergies  Allergen Reactions   Codeine Nausea And Vomiting   Oxycodone-Acetaminophen Nausea And Vomiting   Sulfa Antibiotics Nausea And Vomiting    Patient Measurements: Height: '5\' 4"'$  (162.6 cm) Weight: 76.2 kg (168 lb) IBW/kg (Calculated) : 54.7 Heparin Dosing Weight: 70.7 kg  Vital Signs: Temp: 99.1 F (37.3 C) (08/13 1913) Temp Source: Oral (08/13 1913) BP: 157/62 (08/14 0030) Pulse Rate: 74 (08/14 0030)  Labs: Recent Labs    06/30/21 1934 07/01/21 0010  HGB 15.2*  --   HCT 44.5  --   PLT 371  --   APTT  --  30  LABPROT  --  13.6  INR  --  1.0  HEPARINUNFRC  --  0.62  CREATININE 0.89  --     Estimated Creatinine Clearance: 62.1 mL/min (by C-G formula based on SCr of 0.89 mg/dL).   Medical History: No past medical history on file.  Medications:  PTA Med:  Apixaban 2.5 mg BID  Assessment: Pt is a 65 yo female with med hx of PAD D s/p R leg revascularization with stents on Eliquis, presenting to ED c/o worsening R leg pain.  Goal of Therapy:  Heparin level 0.3-0.7 units/ml aPTT 66-102 seconds Monitor platelets by anticoagulation protocol: Yes   Plan:  Heparin bolus 5000 units x 1 Start heparin infusion at 1200 units/hr Will follow aPTT until correlation with HL Check aPTT in 6 hr after start of infusion Daily HL and CBC while on heparin  Renda Rolls, PharmD, [facility] 07/01/2021 1:24 AM

## 2021-07-01 NOTE — H&P (Signed)
History and Physical    TAWATHA HOLIK [id number] DOB: 09/17/1956 DOA: 06/30/2021  PCP: Ulyess Blossom, PA  Patient coming from: Home  I have personally briefly reviewed patient's old medical records in [place]  Chief Complaint: right LE pain  HPI: ROSSETTA Frazier is a 65 y.o. female with medical history significant for PAD D s/p extensive right lower extremity revascularization with stents on Eliquis, ongoing tobacco use, bipolar disorder, and hyperlipidemia who presents with concerns of worsening right lower extremity pain.  Patient reports that about 3 days ago she began to have worsening right lower extremity pain.  Mostly at her foot and also the lateral thigh.  She has chronic numbness and tingling that is no worse.  Back in April, she underwent extensive right lower extremity revascularization and had early rethrombosis requiring takeback to angiography where thrombectomy was performed.  She was then started on full anticoagulation with Eliquis.  She continues to smoke multiple packs per day.  States she was prescribed medication by her PCP to help with cessation but was not covered by insurance.  In the ED, CT angio of the abdomen aorta with iliofemoral runoff showed occluded right superficial femoral and popliteal artery stents. CBC and BMP unremarkable. ED physician discussed with vascular surgery who recommended starting her on heparin infusion and will see in consultation in the morning for likely intervention.  Hospitalist then called for admission.  Review of Systems: Constitutional: No Weight Change, No Fever ENT/Mouth: No sore throat, No Rhinorrhea Eyes: No Eye Pain, No Vision Changes Cardiovascular: No Chest Pain, no SOB, + Claudication, No Edema, No Palpitations Respiratory: + Cough, + Sputum, No Wheezing, no Dyspnea  Gastrointestinal: No Nausea, No Vomiting, No Diarrhea, No Constipation, No Pain Genitourinary: no Urinary  Incontinence Musculoskeletal: No Arthralgias, No Myalgias Skin: No Skin Lesions, No Pruritus, Neuro: no Weakness, + Numbness Psych: No Anxiety/Panic, No Depression, no decrease appetite Heme/Lymph: No Bruising, No Bleeding  Past Surgical History:  Procedure Laterality Date   APPENDECTOMY     LOWER EXTREMITY ANGIOGRAPHY Right 03/01/2021   Procedure: LOWER EXTREMITY ANGIOGRAPHY;  Surgeon: Algernon Huxley, MD;  Location: [facility];  Service: Cardiovascular;  Laterality: Right;   LOWER EXTREMITY ANGIOGRAPHY Right 03/01/2021   Procedure: Lower Extremity Angiography;  Surgeon: Katha Cabal, MD;  Location: [facility];  Service: Cardiovascular;  Laterality: Right;     reports that she has been smoking. She has never used smokeless tobacco. She reports that she does not drink alcohol and does not use drugs. Social History  Allergies  Allergen Reactions   Codeine Nausea And Vomiting   Oxycodone-Acetaminophen Nausea And Vomiting   Sulfa Antibiotics Nausea And Vomiting    No family history on file.   Prior to Admission medications   Medication Sig Start Date End Date Taking? Authorizing Provider  albuterol (VENTOLIN HFA) 108 (90 Base) MCG/ACT inhaler Inhale 2 puffs into the lungs every 4 (four) hours as needed for wheezing or shortness of breath. 06/23/19  Yes [provider]  apixaban (ELIQUIS) 2.5 MG TABS tablet Take 1 tablet (2.5 mg total) by mouth 2 (two) times daily. 03/03/21  Yes Schnier, Dolores Lory, MD  aspirin EC 81 MG EC tablet Take 1 tablet (81 mg total) by mouth daily. Swallow whole. 03/03/21  Yes Schnier, Dolores Lory, MD  atorvastatin (LIPITOR) 10 MG tablet Take 1 tablet (10 mg total) by mouth daily. 03/01/21 03/01/22 Yes Dew, Erskine Squibb, MD  clopidogrel (PLAVIX) 75 MG  tablet Take 1 tablet (75 mg total) by mouth daily. 03/01/21  Yes Dew, Erskine Squibb, MD  lamoTRIgine (LAMICTAL) 200 MG tablet Take 400 mg by mouth at bedtime. 10/27/20  Yes [provider]   QUEtiapine (SEROQUEL) 200 MG tablet Take 400 mg by mouth at bedtime. 02/11/21  Yes [provider]    Physical Exam: Vitals:   06/30/21 1915 06/30/21 2216 07/01/21 0015 07/01/21 0030  BP:  (!) 141/70  (!) 157/62  Pulse:  88 68 74  Resp:  17 13   Temp:      TempSrc:      SpO2:  98% 99% 97%  Weight: 76.2 kg     Height: '5\' 4"'$  (1.626 m)       Constitutional: NAD, calm, comfortable, elderly female sitting upright in bed Vitals:   06/30/21 1915 06/30/21 2216 07/01/21 0015 07/01/21 0030  BP:  (!) 141/70  (!) 157/62  Pulse:  88 68 74  Resp:  17 13   Temp:      TempSrc:      SpO2:  98% 99% 97%  Weight: 76.2 kg     Height: '5\' 4"'$  (1.626 m)      Eyes: PERRL, lids and conjunctivae normal ENMT: Mucous membranes are moist.  Neck: normal, supple, no masses, no thyromegaly Respiratory: clear to auscultation bilaterally, no wheezing, no crackles. Normal respiratory effort. No accessory muscle use.  Occasional cough. Cardiovascular: Regular rate and rhythm, no murmurs / rubs / gallops. No extremity edema.  Diminished pedal pulses bilaterally.  Less than 2-second capillary refill of the right lower extremity.  No discoloration of the right lower extremity. Abdomen: no tenderness, no masses palpated.  Bowel sounds positive.  Musculoskeletal: no clubbing / cyanosis. No joint deformity upper and lower extremities. Good ROM, no contractures. Normal muscle tone.  Skin: no rashes, lesions, ulcers. No induration Neurologic: CN 2-12 grossly intact. Sensation intact, Strength 5/5 in all 4.  Psychiatric: Normal judgment and insight. Alert and oriented x 3. Normal mood.     Labs on Admission: I have personally reviewed following labs and imaging studies  CBC: Recent Labs  Lab 06/30/21 1934  WBC 9.8  NEUTROABS 6.5  HGB 15.2*  HCT 44.5  MCV 96.1  PLT 123456   Basic Metabolic Panel: Recent Labs  Lab 06/30/21 1934  NA 138  K 4.0  CL 106  CO2 25  GLUCOSE 97  BUN 7*  CREATININE 0.89   CALCIUM 9.2   GFR: Estimated Creatinine Clearance: 62.1 mL/min (by C-G formula based on SCr of 0.89 mg/dL). Liver Function Tests: No results for input(s): AST, ALT, ALKPHOS, BILITOT, PROT, ALBUMIN in the last 168 hours. No results for input(s): LIPASE, AMYLASE in the last 168 hours. No results for input(s): AMMONIA in the last 168 hours. Coagulation Profile: Recent Labs  Lab 07/01/21 0010  INR 1.0   Cardiac Enzymes: No results for input(s): CKTOTAL, CKMB, CKMBINDEX, TROPONINI in the last 168 hours. BNP (last 3 results) No results for input(s): PROBNP in the last 8760 hours. HbA1C: No results for input(s): HGBA1C in the last 72 hours. CBG: No results for input(s): GLUCAP in the last 168 hours. Lipid Profile: No results for input(s): CHOL, HDL, LDLCALC, TRIG, CHOLHDL, LDLDIRECT in the last 72 hours. Thyroid Function Tests: No results for input(s): TSH, T4TOTAL, FREET4, T3FREE, THYROIDAB in the last 72 hours. Anemia Panel: No results for input(s): VITAMINB12, FOLATE, FERRITIN, TIBC, IRON, RETICCTPCT in the last 72 hours. Urine analysis: No results found for:  COLORURINE, APPEARANCEUR, LABSPEC, [place], GLUCOSEU, HGBUR, BILIRUBINUR, KETONESUR, PROTEINUR, UROBILINOGEN, NITRITE, LEUKOCYTESUR  Radiological Exams on Admission: CT ANGIO AO+BIFEM W & OR WO CONTRAST  Result Date: 06/30/2021 CLINICAL DATA:  Right hip and thigh pain for several days, history of prior vascular stent placement EXAM: CT ANGIOGRAPHY OF ABDOMINAL AORTA WITH ILIOFEMORAL RUNOFF TECHNIQUE: Multidetector CT imaging of the abdomen, pelvis and lower extremities was performed using the standard protocol during bolus administration of intravenous contrast. Multiplanar CT image reconstructions and MIPs were obtained to evaluate the vascular anatomy. CONTRAST:  129m OMNIPAQUE IOHEXOL 350 MG/ML SOLN COMPARISON:  Angiography from 03/01/2021 FINDINGS: VASCULAR Aorta: Atherosclerotic calcifications of the abdominal aorta are  noted. No aneurysmal dilatation is seen. 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 SMA: Patent without evidence of aneurysm, dissection, vasculitis or significant stenosis. Renals: Mild atherosclerotic changes are noted without focal stenosis. Single renal arteries are seen bilaterally. IMA: Patent without evidence of aneurysm, dissection, vasculitis or significant stenosis. RIGHT Lower Extremity Inflow: Iliac artery stent is noted in the common iliac artery extending into the proximal aspect of the external iliac artery. Internal and external iliac artery are patent. Common femoral artery demonstrates some calcification. Runoff: The superficial femoral artery is somewhat diminutive with occlusion approximately 10 cm from the origin of the superficial femoral artery. The native superficial femoral artery is thrombosed for a short segment prior to visualization of thrombosed superficial femoral artery and popliteal artery stent. Below the popliteal arterial stent, there is reconstitution of the posterior tibial and peroneal artery. Anterior tibial artery is not well visualized. Posterior tibial artery continues into the foot. LEFT Lower Extremity Inflow: Common iliac artery on the left demonstrates focal stent which is widely patent. The external and internal iliac artery are patent. Common femoral artery demonstrates some calcification. Runoff: The superficial femoral artery on the left is diminutive as well but larger than that seen on the right with heavy vascular calcification identified. It occludes just above the level of the adductor canal however reconstitution via muscular collaterals of the popliteal artery is seen. Popliteal artery shows patent trifurcation with three-vessel runoff to the ankle and the anterior and posterior tibial arteries extending into the foot. Veins: No specific venous abnormality is noted. Review of the MIP images confirms the above findings.  NON-VASCULAR Lower chest: Lung bases are free of acute infiltrate or sizable effusion. Hepatobiliary: Few scattered calcifications are noted within the liver. The gallbladder is within normal limits. Pancreas: Unremarkable. No pancreatic ductal dilatation or surrounding inflammatory changes. Spleen: Normal in size without focal abnormality. Adrenals/Urinary Tract: Adrenal glands are unremarkable. Kidneys show no renal calculi or obstructive changes. The bladder is well distended. Stomach/Bowel: Fecal material is noted throughout the colon consistent with a mild degree of constipation although no obstructive changes are seen. The appendix is not well visualized consistent with prior surgical history. Small bowel and stomach are within normal limits. Lymphatic: No sizable lymphadenopathy is noted. Reproductive: Uterus and bilateral adnexa are unremarkable. Other: No abdominal wall hernia or abnormality. No abdominopelvic ascites. Musculoskeletal: Degenerative changes of lumbar spine are noted. No acute bony abnormality is seen. IMPRESSION: VASCULAR Patent arterial stents within the common iliac arteries bilaterally. Occluded right superficial femoral and popliteal arteries stent with occlusion of the native superficial femoral artery approximately 10 cm beyond its origin. There is reconstitution via muscular collaterals of the peroneal and posterior tibial artery as described. On the left, the superficial femoral artery is diminutive and shows occlusion at the level  of the adductor canal. The degree of occlusion is short in segment measuring approximately 2.4 cm with significant reconstitution via muscular collaterals. Three-vessel runoff to the left ankle is noted. NON-VASCULAR Changes suggestive of mild colonic constipation. No other significant nonvascular abnormality is noted. Electronically Signed   By: Inez Catalina M.D.   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 past midnight for likely intervention in the morning -Continue aspirin and Plavix -Frequent neurovascular checks  Hyperlipidemia -Continue statin  bipolar disorder -Continue Seroquel, Lamictal  Ongoing tobacco abuse -Stressed the importance of cessation.  Nicotine patch offered.  DVT prophylaxis:.Heparin infusion  code Status: Full Family Communication: Plan discussed with patient at bedside  disposition Plan: Home with at least 2 midnight stays  Consults called: Vascular surgery Admission status: inpatient  Level of care: Progressive Cardiac  Status is: Inpatient  Remains inpatient appropriate because:Inpatient level of care appropriate due to severity of illness  Dispo: The patient is from: Home              Anticipated d/c is to: Home              Patient currently is not medically stable to d/c.   Difficult to place patient No         Orene Desanctis DO [facility]   If 7PM-7AM, please contact night-coverage [email or web address]   07/01/2021, 1:04 AM

## 2021-07-01 NOTE — ED Notes (Signed)
Pt still sleeping. Provided pillow and warm blanket and helped pt reposition in bed. Pt back asleep.

## 2021-07-01 NOTE — Progress Notes (Signed)
[place] for heparin infusion Indication: VTE Treatment  Allergies  Allergen Reactions   Codeine Nausea And Vomiting   Oxycodone-Acetaminophen Nausea And Vomiting   Sulfa Antibiotics Nausea And Vomiting    Patient Measurements: Height: '5\' 4"'$  (162.6 cm) Weight: 76.2 kg (168 lb) IBW/kg (Calculated) : 54.7 Heparin Dosing Weight: 70.7 kg  Vital Signs: Temp: 98.9 F (37.2 C) (08/14 0300) Temp Source: Oral (08/14 0300) BP: 112/51 (08/14 0800) Pulse Rate: 65 (08/14 0800)  Labs: Recent Labs    06/30/21 1934 07/01/21 0010 07/01/21 0618  HGB 15.2*  --  14.2  HCT 44.5  --  42.5  PLT 371  --  316  APTT  --  30 125*  LABPROT  --  13.6  --   INR  --  1.0  --   HEPARINUNFRC  --  0.62 >1.10*  CREATININE 0.89  --   --      Estimated Creatinine Clearance: 62.1 mL/min (by C-G formula based on SCr of 0.89 mg/dL).   Medical History: No past medical history on file.  Medications:  PTA Med:  Apixaban 2.5 mg BID  Assessment: Pt is a 65 yo female with med hx of PAD D s/p R leg revascularization with stents on Eliquis, presenting to ED c/o worsening R leg pain.  8/14 0618 aPTT 125 HL > 1.1  Goal of Therapy:  Heparin level 0.3-0.7 units/ml, once heparin level and aPTT correlate.  aPTT 66-102 seconds Monitor platelets by anticoagulation protocol: Yes   Plan:  aPTT is supra-therapeutic. Will decrease heparin infusion to 1050  units/hr. Recheck aPTT in 6 hour. Heparin level and CBC daily while on heparin.   Eleonore Chiquito, PharmD 07/01/2021 8:54 AM

## 2021-07-01 NOTE — ED Notes (Signed)
L wrist IV placed for heparin drip. R AC  IV patent but pt bends arm when asleep. Pt awake, alert. Spoke with Shirlean Mylar, RN from [place] and gave report.

## 2021-07-01 NOTE — ED Notes (Signed)
Pt. Placed on cont. Cardiac monitoring.  

## 2021-07-02 ENCOUNTER — Encounter
Admission: EM | Disposition: A | Discharge status: Home / Self Care | Payer: Self-pay | Source: Home / Self Care | Attending: Vascular Surgery

## 2021-07-02 ENCOUNTER — Encounter: Payer: Self-pay | Admitting: Vascular Surgery

## 2021-07-02 DIAGNOSIS — I70221 Atherosclerosis of native arteries of extremities with rest pain, right leg: Secondary | ICD-10-CM

## 2021-07-02 DIAGNOSIS — I998 Other disorder of circulatory system: Secondary | ICD-10-CM

## 2021-07-02 HISTORY — PX: LOWER EXTREMITY INTERVENTION: CATH118252

## 2021-07-02 LAB — CBC
HCT: 39.5 % (ref 36.0–46.0)
HCT: 38.6 % (ref 36.0–46.0)
Hemoglobin: 13.5 g/dL (ref 12.0–15.0)
Hemoglobin: 13.3 g/dL (ref 12.0–15.0)
MCH: 32.7 pg (ref 26.0–34.0)
MCH: 33.2 pg (ref 26.0–34.0)
MCHC: 34.2 g/dL (ref 30.0–36.0)
MCHC: 34.5 g/dL (ref 30.0–36.0)
MCV: 95.6 fL (ref 80.0–100.0)
MCV: 96.3 fL (ref 80.0–100.0)
Platelets: 197 10*3/uL (ref 150–400)
Platelets: 194 10*3/uL (ref 150–400)
RBC: 4.13 MIL/uL (ref 3.87–5.11)
RBC: 4.01 MIL/uL (ref 3.87–5.11)
RDW: 13.9 % (ref 11.5–15.5)
RDW: 13.7 % (ref 11.5–15.5)
WBC: 9.9 10*3/uL (ref 4.0–10.5)
WBC: 11.2 10*3/uL — ABNORMAL HIGH (ref 4.0–10.5)
nRBC: 0 % (ref 0.0–0.2)
nRBC: 0 % (ref 0.0–0.2)

## 2021-07-02 LAB — FIBRINOGEN: Fibrinogen: 140 mg/dL — ABNORMAL LOW (ref 210–475)

## 2021-07-02 LAB — HEPARIN LEVEL (UNFRACTIONATED): Heparin Unfractionated: 0.19 IU/mL — ABNORMAL LOW (ref 0.30–0.70)

## 2021-07-02 SURGERY — LOWER EXTREMITY INTERVENTION
Anesthesia: Moderate Sedation | Laterality: Right

## 2021-07-02 MED ORDER — NITROGLYCERIN 1 MG/10 ML FOR IR/CATH LAB
INTRA_ARTERIAL | Status: AC
  Filled 2021-07-02: qty 10

## 2021-07-02 MED ORDER — TIROFIBAN HCL IN NACL 5-0.9 MG/100ML-% IV SOLN
0.1500 ug/kg/min | INTRAVENOUS | Status: DC
  Filled 2021-07-02 (×3): qty 100

## 2021-07-02 MED ORDER — FENTANYL CITRATE (PF) 100 MCG/2ML IJ SOLN
INTRAMUSCULAR | Status: AC
  Filled 2021-07-02: qty 2

## 2021-07-02 MED ORDER — MIDAZOLAM HCL 2 MG/2ML IJ SOLN
INTRAMUSCULAR | Status: AC
  Filled 2021-07-02: qty 2

## 2021-07-02 MED ORDER — HEPARIN SODIUM (PORCINE) 1000 UNIT/ML IJ SOLN
INTRAMUSCULAR | Status: DC | PRN
  Administered 2021-07-02: 3000 [IU] via INTRAVENOUS

## 2021-07-02 MED ORDER — NITROGLYCERIN 1 MG/10 ML FOR IR/CATH LAB
INTRA_ARTERIAL | Status: DC | PRN
  Administered 2021-07-02: 300 ug

## 2021-07-02 MED ORDER — APIXABAN 5 MG PO TABS
5.0000 mg | ORAL_TABLET | Freq: Two times a day (BID) | ORAL | Status: DC
  Administered 2021-07-03: 5 mg via ORAL
  Filled 2021-07-02: qty 1

## 2021-07-02 MED ORDER — MIDAZOLAM HCL 2 MG/2ML IJ SOLN
INTRAMUSCULAR | Status: DC | PRN
  Administered 2021-07-02 (×4): 0.5 mg via INTRAVENOUS

## 2021-07-02 MED ORDER — CEFAZOLIN SODIUM-DEXTROSE 2-4 GM/100ML-% IV SOLN: 2.0000 g | Freq: Once | INTRAVENOUS | Status: AC

## 2021-07-02 MED ORDER — TIROFIBAN (AGGRASTAT) BOLUS VIA INFUSION
25.0000 ug/kg | Freq: Once | INTRAVENOUS | Status: AC
  Administered 2021-07-02: 1912.5 ug via INTRAVENOUS
  Filled 2021-07-02: qty 39

## 2021-07-02 MED ORDER — HEPARIN SODIUM (PORCINE) 1000 UNIT/ML IJ SOLN
INTRAMUSCULAR | Status: AC
  Filled 2021-07-02: qty 1

## 2021-07-02 MED ORDER — TIROFIBAN HCL IV 12.5 MG/250 ML
INTRAVENOUS | Status: AC
  Administered 2021-07-02: 0.15 ug/kg/min via INTRAVENOUS
  Filled 2021-07-02: qty 250

## 2021-07-02 MED ORDER — HEPARIN (PORCINE) 25000 UT/250ML-% IV SOLN
600.0000 [IU]/h | INTRAVENOUS | Status: DC
  Administered 2021-07-02: 600 [IU]/h via INTRA_ARTERIAL
  Filled 2021-07-02: qty 250

## 2021-07-02 MED ORDER — IODIXANOL 320 MG/ML IV SOLN
INTRAVENOUS | Status: DC | PRN
  Administered 2021-07-02: 40 mL

## 2021-07-02 MED ORDER — TIROFIBAN HCL IN NACL 5-0.9 MG/100ML-% IV SOLN
0.1500 ug/kg/min | INTRAVENOUS | Status: AC
  Administered 2021-07-02 – 2021-07-03 (×2): 0.15 ug/kg/min via INTRAVENOUS
  Filled 2021-07-02 (×2): qty 100

## 2021-07-02 MED ORDER — FENTANYL CITRATE (PF) 100 MCG/2ML IJ SOLN
INTRAMUSCULAR | Status: DC | PRN
  Administered 2021-07-02 (×4): 25 ug via INTRAVENOUS

## 2021-07-02 SURGICAL SUPPLY — 18 items
BALLN LUTONIX 018 4X100X130 (BALLOONS) ×2
BALLN LUTONIX 5X220X130 (BALLOONS) ×2
BALLN ULTRVRSE 018 2.5X150X150 (BALLOONS) ×2
BALLOON LUTONIX 018 4X100X130 (BALLOONS) IMPLANT
BALLOON LUTONIX 5X220X130 (BALLOONS) IMPLANT
BALLOON ULTRVS 018 2.5X150X150 (BALLOONS) IMPLANT
CANISTER PENUMBRA ENGINE (MISCELLANEOUS) ×1 IMPLANT
CATH INDIGO CAT6 KIT (CATHETERS) ×1 IMPLANT
CATH VERT 5FR 125CM (CATHETERS) ×1 IMPLANT
DEVICE SAFEGUARD 24CM (GAUZE/BANDAGES/DRESSINGS) ×1 IMPLANT
DEVICE STARCLOSE SE CLOSURE (Vascular Products) ×1 IMPLANT
GUIDEWIRE PFTE-COATED .018X300 (WIRE) ×1 IMPLANT
KIT ENCORE 26 ADVANTAGE (KITS) ×1 IMPLANT
PACK ANGIOGRAPHY (CUSTOM PROCEDURE TRAY) ×1 IMPLANT
STENT VIABAHN 5X7.5X120 (Permanent Stent) ×1 IMPLANT
STENT VIABAHN 6X250X120 (Permanent Stent) ×1 IMPLANT
WIRE G V18X300CM (WIRE) ×1 IMPLANT
WIRE GUIDERIGHT .035X150 (WIRE) ×2 IMPLANT

## 2021-07-02 NOTE — Progress Notes (Signed)
PROGRESS NOTE  Kristen Frazier  DOB: 1956/03/05  PCP: Ulyess Blossom, [place] [id number]  DOA: 06/30/2021  LOS: 1 day  Hospital Day: 3   Chief Complaint  Patient presents with   Leg Pain    C/o right hip/thigh pain/numbness x several days. Pt. States she had stents placed in that leg 3 months ago. Pt. Denies any CP, SOB, dizziness.     Brief narrative: Kristen Frazier is a 65 y.o. female with PMH significant for bipolar disorder, chronic smoking, HLD, PAD s/p extensive right lower extremity revascularization with stents on Eliquis. Patient presented to the ED on 8/13 with concerns of worsening right lower extremity pain for 3 days. In April 2022, patient underwent extensive right lower extremity revascularization and had early rethrombosis requiring takeback to angiography where thrombectomy was performed.  She was then started on full anticoagulation with Eliquis.  She continues to smoke multiple packs per day.   In the ED patient had a temperature 99.1, blood pressure elevated to 144/60 Labs mostly unremarkable CT angio of abdominal aorta with iliofemoral runoff showed occluded right superficial femoral and popliteal artery stents. ED physician discussed with vascular surgery who recommended starting her on heparin infusion. Admitted to hospitalist service 8/14, patient underwent thrombectomy needed a repeat procedure again on 8/15.  Subjective: Patient was seen and examined this afternoon in ICU.  Lying on bed.  Not in distress hemodynamically stable.  Not on supplemental oxygen.  This morning patient required repeat thrombectomy by vascular surgery in OR.  Currently on tirofiban drip.  Assessment/Plan: Recurrent rethrombosis of RLE stent Occluded right superficial femoral and popliteal artery  Hx of extensive PAD -previously already on Eliquis but continues to have heavy tobacco use -CT angio on admission showed occluded right superficial femoral and popliteal artery  stents. -8/14, vascular surgery did an aortogram and thrombectomy, tPA infusion and angioplasty of the involved arteries. -8/15, patient needed repeat procedure. -Continue aspirin, Plavix -Currently on tirofiban drip.   Hyperlipidemia -Continue statin   bipolar disorder -Continue Seroquel, Lamictal   Ongoing tobacco abuse -Smokes multiple packs of cigarettes a day.  Stressed the importance of cessation.  Nicotine patch offered.   Mobility: Post procedure PT eval Code Status:   Code Status: Full Code  Nutritional status: Body mass index is 28.95 kg/m.     Diet: Cardiac diet once allowed by surgery team Diet Order             Diet Heart Room service appropriate? Yes; Fluid consistency: Thin  Diet effective now                  DVT prophylaxis: Heparin drip  apixaban (ELIQUIS) tablet 5 mg   Antimicrobials: None Fluid: NS at 75 Consultants: Vascular surgery Family Communication: None at bedside  Status is: Inpatient  Remains inpatient appropriate because: POD 0,   Dispo: The patient is from: Home              Anticipated d/c is to: Home in 2 to 3 days              Patient currently is not medically stable to d/c.   Difficult to place patient No     Infusions:   sodium chloride 75 mL/hr at 07/02/21 0600   tirofiban      Scheduled Meds:  [START ON 07/03/2021] apixaban  5 mg Oral BID   atorvastatin  10 mg Oral Daily   Chlorhexidine Gluconate Cloth  6 each Topical Q0600  fentaNYL       heparin sodium (porcine)       lamoTRIgine  400 mg Oral QHS   midazolam       nicotine  21 mg Transdermal Daily   QUEtiapine  400 mg Oral QHS    Antimicrobials: Anti-infectives (From admission, onward)    Start     Dose/Rate Route Frequency Ordered Stop   07/02/21 1030  ceFAZolin (ANCEF) IVPB 2g/100 mL premix        2 g 200 mL/hr over 30 Minutes Intravenous  Once 07/02/21 1018 07/01/21 1316   07/01/21 1745  ceFAZolin (ANCEF) IVPB 2g/100 mL premix        2 g 200  mL/hr over 30 Minutes Intravenous  Once 07/01/21 1657 07/02/21 1054   07/01/21 1050  clindamycin (CLEOCIN) 300 MG/50ML IVPB  Status:  Discontinued       Note to Pharmacy: Fransico Michael   : cabinet override      07/01/21 1050 07/01/21 1406       PRN meds: acetaminophen, albuterol, HYDROcodone-acetaminophen, morphine injection, ondansetron (ZOFRAN) IV   Objective: Vitals:   07/02/21 1300 07/02/21 1400  BP: (!) 167/60 (!) 163/57  Pulse: 64 67  Resp: 15   Temp:    SpO2: (!) 89% 96%    Intake/Output Summary (Last 24 hours) at 07/02/2021 1642 Last data filed at 07/02/2021 1200 Gross per 24 hour  Intake 1733.51 ml  Output 350 ml  Net 1383.51 ml   Filed Weights   06/30/21 1915 07/01/21 1352 07/01/21 1400  Weight: 76.2 kg 76.5 kg 76.5 kg   Weight change: 0.296 kg Body mass index is 28.95 kg/m.   Physical Exam: General exam: Pleasant, elderly Caucasian female.  Pain controlled Skin: No rashes, lesions or ulcers. HEENT: Atraumatic, normocephalic, no obvious bleeding Lungs: Clear to auscultation bilaterally CVS: Regular rate and rhythm, no murmur GI/Abd soft, nontender, nondistended, bowel sound present CNS: Alert, awake, oriented x3 Psychiatry: Mood appropriate Extremities: No pedal edema, no calf tenderness.  Data Review: I have personally reviewed the laboratory data and studies available.  Recent Labs  Lab 06/30/21 1934 07/01/21 0618 07/01/21 2029 07/02/21 0205 07/02/21 1333  WBC 9.8 8.1 12.2* 9.9 11.2*  NEUTROABS 6.5  --   --   --   --   HGB 15.2* 14.2 15.2* 13.5 13.3  HCT 44.5 42.5 45.9 39.5 38.6  MCV 96.1 94.7 97.2 95.6 96.3  PLT 371 316 253 197 194    Recent Labs  Lab 06/30/21 1934 07/01/21 2029  NA 138 139  K 4.0 3.8  CL 106 107  CO2 25 24  GLUCOSE 97 98  BUN 7* 8  CREATININE 0.89 0.73  CALCIUM 9.2 8.7*     F/u labs ordered Unresulted Labs (From admission, onward)    None       Signed, Terrilee Croak, MD [facility] 07/02/2021

## 2021-07-02 NOTE — Plan of Care (Signed)
Patient continues with pain, appears more comfortable.

## 2021-07-02 NOTE — Progress Notes (Signed)
Dr.Taormina made aware of Fibrinogen, verbal order to Dc TPA through catheter and infuse Heparin 600 units through catheter in addition to Heparin infusing through other intervenous port of sheath.

## 2021-07-02 NOTE — Interval H&P Note (Signed)
History and Physical Interval Note:  07/02/2021 8:17 AM  Kristen Frazier  has presented today for surgery, with the diagnosis of limb ischemia.  The various methods of treatment have been discussed with the patient and family. After consideration of risks, benefits and other options for treatment, the patient has consented to  Procedure(s): LOWER EXTREMITY INTERVENTION (Right) as a surgical intervention.  The patient's history has been reviewed, patient examined, no change in status, stable for surgery.  I have reviewed the patient's chart and labs.  Questions were answered to the patient's satisfaction.     Leotis Pain

## 2021-07-02 NOTE — Op Note (Signed)
[facility]  Percutaneous Study/Intervention Procedural Note   Date of Surgery: 07/02/2021  Surgeon(s):Taysean Wager    Assistants:none  Pre-operative Diagnosis: PAD with rest Frazier RLE, acute on chronic ischemia, s/p overnight thrombolytic therapy  Post-operative diagnosis:  Same  Procedure(s) Performed:             1.  Right lower extremity angiogram             2.  Mechanical thrombectomy to right SFA, popliteal artery, tibioperoneal trunk, and peroneal arteries             3.  Covered stent placement to the right proximal SFA with 6 mm diameter by 25 cm length Viabahn stent             4.  Percutaneous transluminal angioplasty of the tibioperoneal trunk and proximal peroneal artery with a 4 mm diameter Lutonix drug-coated angioplasty balloon             5.  Viabahn stent placement to the right tibioperoneal trunk and most distal popliteal artery with 5 mm diameter by 7.5 cm length stent for residual stenosis and thrombus after angioplasty  6.  Percutaneous transluminal angioplasty of the right posterior tibial artery with 2.5 mm diameter by 15 cm length angioplasty balloon             7.  StarClose closure device left femoral artery  EBL: 200 cc  Contrast: 40 cc  Fluoro Time: 8.2 minutes  Moderate Conscious Sedation Time: approximately 39 minutes using 2 mg of Versed and 100 mcg of Fentanyl              Indications:  Patient is a 65 y.o.female with recurrent rest Frazier of the right foot. The patient has been getting overnight thrombolytic therapy in an attempt at limb salvage.  The patient is brought in for angiography for further evaluation and potential treatment.  Due to the limb threatening nature of the situation, angiogram was performed for attempted limb salvage. The patient is aware that if the procedure fails, amputation would be expected.  The patient also understands that even with successful revascularization, amputation may still be required due to  the severity of the situation.  Risks and benefits are discussed and informed consent is obtained.   Procedure:  The patient was identified and appropriate procedural time out was performed.  The patient was then placed supine on the table and prepped and draped in the usual sterile fashion. Moderate conscious sedation was administered during a face to face encounter with the patient throughout the procedure with my supervision of the RN administering medicines and monitoring the patient's vital signs, pulse oximetry, telemetry and mental status throughout from the start of the procedure until the patient was taken to the recovery room.  The existing thrombolytic catheter was removed after placing a V 18 wire into the distal peroneal artery.  Selective right lower extremity angiogram was then performed. This demonstrated significant improvement with residual thrombus and stenosis in the proximal SFA creating greater than 70% stenosis in 2 areas 1 near the origin and 1 few centimeters above the previously placed stent.  The stent itself had cleaned up.  At the bottom of the previously placed stent in the popliteal artery and tibioperoneal trunk there was near occlusive thrombus and stenosis.  The anterior tibial artery was now totally occluded without distal reconstitution.  Both the peroneal and posterior tibial arteries reconstituted after proximal occlusions. It was felt that it was  in the patient's best interest to proceed with intervention after these images to avoid a second procedure and a larger amount of contrast and fluoroscopy based off of the findings from the initial angiogram. The patient was systemically heparinized. I then used a penumbra CAT 6 device to perform mechanical thrombectomy in the right SFA, popliteal artery, tibioperoneal trunk, the proximal portion of the peroneal artery.  Several passes were made.  After thrombectomy, the proximal portion thrombus was largely resolved, but there were  still 2 areas of significant stenosis of greater than 65 to 70% and I elected to cover this area with a covered stent bridging down to the previously placed stent.  A 6 mm diameter by 25 cm length Viabahn stent was deployed from the origin of the SFA down into the previously placed stent in the mid SFA.  This is postdilated with a 5 mm balloon.  There is less than 10% residual stenosis following this.  The tibioperoneal trunk and both the peroneal and posterior tibial artery still had near occlusive thrombus and stenosis.  A 4 mm diameter by 10 cm length Lutonix drug-coated angioplasty balloon was inflated to 6 atm for 1 minute in the proximal peroneal artery and tibioperoneal trunk, but there remained significant residual thrombus and stenosis in the tibioperoneal trunk.  I then took a 5 mm diameter by 7.5 cm length Viabahn stent from the most distal tibioperoneal trunk just above the bifurcation up into the previously placed stent in the popliteal artery.  This is postdilated with a 4 mm balloon.  Following this, there was now inline flow with less than 20% residual stenosis through the peroneal artery and tibioperoneal trunk.  The posterior tibial artery still had a near occlusive stenosis at the origin and this was still a robust vessel distally.  I was able to cannulate this with a 0.018 advantage wire and a Kumpe catheter and parked the wire down in the foot.  I then treated the posterior tibial artery with a 2.5 mm diameter by 15 cm length angioplasty balloon inflated to 10 atm for 1 minute.  Completion imaging showed two-vessel runoff down to the foot with less than 20% residual stenosis and markedly improved flow.  I elected to terminate the procedure. The sheath was removed and StarClose closure device was deployed in the left femoral artery with excellent hemostatic result. The patient was taken to the recovery room in stable condition having tolerated the procedure well.  Findings:                             Right lower Extremity:  This demonstrated significant improvement with residual thrombus and stenosis in the proximal SFA creating greater than 70% stenosis in 2 areas 1 near the origin and 1 few centimeters above the previously placed stent.  The stent itself had cleaned up.  At the bottom of the previously placed stent in the popliteal artery and tibioperoneal trunk there was near occlusive thrombus and stenosis.  The anterior tibial artery was now totally occluded without distal reconstitution.  Both the peroneal and posterior tibial arteries reconstituted after proximal occlusions.   Disposition: Patient was taken to the recovery room in stable condition having tolerated the procedure well.  Complications: None  Kristen Frazier 07/02/2021 11:17 AM   This note was created with Dragon Medical transcription system. Any errors in dictation are purely unintentional.

## 2021-07-02 NOTE — Progress Notes (Addendum)
1600 After removal of 20 ml of air, left groin site oozing bright red blood at puncture site. Air removal postponed for 1 hour.1700 Attempted to remove 10 ml of air. Site immediately started oozing bright red blood again. Patient status changed to step down until PAD removed. Aggrastat infusion timed for 18 hours. Aggrastat infusion should be d/ced at 0630 on 8/16.

## 2021-07-03 ENCOUNTER — Ambulatory Visit (INDEPENDENT_AMBULATORY_CARE_PROVIDER_SITE_OTHER): Payer: Medicare PPO | Admitting: Vascular Surgery

## 2021-07-03 ENCOUNTER — Encounter (INDEPENDENT_AMBULATORY_CARE_PROVIDER_SITE_OTHER): Payer: Medicare PPO

## 2021-07-03 LAB — CBC WITH DIFFERENTIAL/PLATELET
Abs Immature Granulocytes: 0.08 10*3/uL — ABNORMAL HIGH (ref 0.00–0.07)
Basophils Absolute: 0.1 10*3/uL (ref 0.0–0.1)
Basophils Relative: 0 %
Eosinophils Absolute: 0.2 10*3/uL (ref 0.0–0.5)
Eosinophils Relative: 1 %
HCT: 36.5 % (ref 36.0–46.0)
Hemoglobin: 12.3 g/dL (ref 12.0–15.0)
Immature Granulocytes: 1 %
Lymphocytes Relative: 17 %
Lymphs Abs: 2 10*3/uL (ref 0.7–4.0)
MCH: 32 pg (ref 26.0–34.0)
MCHC: 33.7 g/dL (ref 30.0–36.0)
MCV: 95.1 fL (ref 80.0–100.0)
Monocytes Absolute: 1.1 10*3/uL — ABNORMAL HIGH (ref 0.1–1.0)
Monocytes Relative: 9 %
Neutro Abs: 8.3 10*3/uL — ABNORMAL HIGH (ref 1.7–7.7)
Neutrophils Relative %: 72 %
Platelets: 183 10*3/uL (ref 150–400)
RBC: 3.84 MIL/uL — ABNORMAL LOW (ref 3.87–5.11)
RDW: 13.8 % (ref 11.5–15.5)
WBC: 11.7 10*3/uL — ABNORMAL HIGH (ref 4.0–10.5)
nRBC: 0 % (ref 0.0–0.2)

## 2021-07-03 LAB — BASIC METABOLIC PANEL
Anion gap: 8 (ref 5–15)
BUN: 6 mg/dL — ABNORMAL LOW (ref 8–23)
CO2: 24 mmol/L (ref 22–32)
Calcium: 8.4 mg/dL — ABNORMAL LOW (ref 8.9–10.3)
Chloride: 104 mmol/L (ref 98–111)
Creatinine, Ser: 0.76 mg/dL (ref 0.44–1.00)
GFR, Estimated: >60 mL/min (ref >60)
Glucose, Bld: 101 mg/dL — ABNORMAL HIGH (ref 70–99)
Potassium: 3.8 mmol/L (ref 3.5–5.1)
Sodium: 136 mmol/L (ref 135–145)

## 2021-07-03 MED ORDER — TRAMADOL HCL 50 MG PO TABS: 50.0000 mg | ORAL_TABLET | Freq: Four times a day (QID) | ORAL | 0 refills | Status: AC | PRN

## 2021-07-03 MED ORDER — APIXABAN 5 MG PO TABS: 5.0000 mg | ORAL_TABLET | Freq: Two times a day (BID) | ORAL | 5 refills | Status: AC

## 2021-07-03 NOTE — Progress Notes (Signed)
Pt is discharging home, pt's sister will pick her up. Discharge education completed.

## 2021-07-03 NOTE — Progress Notes (Signed)
Patient was discharged today to home by vascular surgery before I rounded on her. [facility] won't bill for today.

## 2021-07-03 NOTE — Discharge Summary (Addendum)
[facility]    Discharge Summary    Patient ID:  Kristen Frazier MRN: [id number] DOB/AGE: 67-Jul-1957 67 y.o.  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  Procedure(s): LOWER EXTREMITY INTERVENTION  Discharged Condition: good  HPI:  Patient presents with profoundly ischemic right foot.  This is acute on chronic ischemia.  She is brought in for attempt at limb salvage.  Hospital Course:  Kristen Frazier is a 65 y.o. female is S/P Right Procedure(s): LOWER EXTREMITY INTERVENTION Extubated: POD #  not applicable Physical exam: Palpable posterior tibial pulse with warm foot. Post-op wounds clean, dry, intact or healing well Pt. Ambulating, voiding and taking PO diet without difficulty. Pt pain controlled with PO pain meds. Labs as below Complications:none  Consults:    Significant Diagnostic Studies: CBC Lab Results  Component Value Date   WBC 11.7 (H) 07/03/2021   HGB 12.3 07/03/2021   HCT 36.5 07/03/2021   MCV 95.1 07/03/2021   PLT 183 07/03/2021    BMET    Component Value Date/Time   NA 136 07/03/2021 0921   K 3.8 07/03/2021 0921   CL 104 07/03/2021 0921   CO2 24 07/03/2021 0921   GLUCOSE 101 (H) 07/03/2021 0921   BUN 6 (L) 07/03/2021 0921   CREATININE 0.76 07/03/2021 0921   CALCIUM 8.4 (L)  07/03/2021 0921   GFRNONAA >60 07/03/2021 0921   COAG Lab Results  Component Value Date   INR 1.0 07/01/2021   INR 1.0 03/01/2021     Disposition:  Discharge to :Home Discharge Instructions     Call MD for:  redness, tenderness, or signs of infection (pain, swelling, bleeding, redness, odor or green/yellow discharge around incision site)   Complete by: As directed    Call MD for:  severe or increased pain, loss or decreased feeling  in affected limb(s)   Complete by: As directed    Call MD for:  temperature >100.5   Complete by: As directed    Driving Restrictions   Complete by: As directed    No driving for 24 hours   Lifting restrictions   Complete by: As directed    No lifting for 48 hours   No dressing needed   Complete by: As directed    Replace only if drainage present   Resume previous diet   Complete by: As directed       Allergies as of 07/03/2021       Reactions   Codeine Nausea And Vomiting   Oxycodone-acetaminophen Nausea And Vomiting   Sulfa Antibiotics Nausea And Vomiting        Medication List     STOP taking these medications    clopidogrel 75 MG tablet Commonly known as: Plavix       TAKE these medications    albuterol 108 (90 Base) MCG/ACT inhaler Commonly known as: VENTOLIN HFA Inhale 2 puffs into the lungs every 4 (four) hours as needed for wheezing or shortness of breath.   apixaban 2.5  MG Tabs tablet Commonly known as: ELIQUIS Take 1 tablet (2.5 mg total) by mouth 2 (two) times daily. What changed: Another medication with the same name was added. Make sure you understand how and when to take each.   apixaban 5 MG Tabs tablet Commonly known as: ELIQUIS Take 1 tablet (5 mg total) by mouth 2 (two) times daily. What changed: You were already taking a medication with the same name, and this prescription was added. Make sure you understand how and when to take each.   aspirin 81 MG EC tablet Take 1 tablet (81 mg total) by mouth  daily. Swallow whole.   atorvastatin 10 MG tablet Commonly known as: Lipitor Take 1 tablet (10 mg total) by mouth daily.   lamoTRIgine 200 MG tablet Commonly known as: LAMICTAL Take 400 mg by mouth at bedtime.   QUEtiapine 200 MG tablet Commonly known as: SEROQUEL Take 400 mg by mouth at bedtime.   traMADol 50 MG tablet Commonly known as: Ultram Take 1 tablet (50 mg total) by mouth every 6 (six) hours as needed.       Verbal and written Discharge instructions given to the patient. Wound care per Discharge AVS  Follow-up Information     Kris Hartmann, NP. Schedule an appointment as soon as possible for a visit in 2 week(s).   Specialty: Vascular Surgery Why: with ABIs appointment July 17, 2021  at 730 AM  ABI and  0830 AM WITH DR Fermin Schwab information: [place] [phone] [phone]         Kris Hartmann, NP Follow up in 2 week(s).   Specialty: Vascular Surgery Why: APPOINTMENT AUG 30,2022 AT [id number] AM ABI AND O800 AM WITH DR Fermin Schwab information: [place] [id number] [phone]                For Vascular Registry We are not prescribing Plavix as we are using full dose Eliquis and the risk of bleeding would be higher with the addition of Plavix.   Signed: Leotis Pain, MD  07/03/2021, 12:04 PM

## 2021-07-17 ENCOUNTER — Other Ambulatory Visit: Payer: Self-pay

## 2021-07-17 ENCOUNTER — Ambulatory Visit (INDEPENDENT_AMBULATORY_CARE_PROVIDER_SITE_OTHER): Payer: Medicare PPO

## 2021-07-17 ENCOUNTER — Encounter (INDEPENDENT_AMBULATORY_CARE_PROVIDER_SITE_OTHER): Payer: Self-pay | Admitting: Nurse Practitioner

## 2021-07-17 ENCOUNTER — Ambulatory Visit (INDEPENDENT_AMBULATORY_CARE_PROVIDER_SITE_OTHER): Payer: Medicare PPO | Admitting: Nurse Practitioner

## 2021-07-17 VITALS — BP 151/78 | HR 70 | Ht 64.0 in | Wt 160.0 lb

## 2021-07-17 DIAGNOSIS — E785 Hyperlipidemia, unspecified: Secondary | ICD-10-CM | POA: Diagnosis not present

## 2021-07-17 DIAGNOSIS — Z720 Tobacco use: Secondary | ICD-10-CM | POA: Diagnosis not present

## 2021-07-17 DIAGNOSIS — I7025 Atherosclerosis of native arteries of other extremities with ulceration: Secondary | ICD-10-CM

## 2021-07-17 NOTE — Progress Notes (Signed)
Subjective:    Patient ID: Kristen Frazier, female    DOB: 03/04/1956, 65 y.o.   MRN: [id number] Chief Complaint  Patient presents with   Follow-up    [facility] 2 wk FU LE Angio     Kristen Frazier is a 65 year old female that returns to the office for followup and review status post angiogram with intervention.  The patient had an acute on chronic ischemic event and had intervention on 07/02/2021 including:  Procedure(s) Performed:             1.  Right lower extremity angiogram             2.  Mechanical thrombectomy to right SFA, popliteal artery, tibioperoneal trunk, and peroneal arteries             3.  Covered stent placement to the right proximal SFA with 6 mm diameter by 25 cm length Viabahn stent             4.  Percutaneous transluminal angioplasty of the tibioperoneal trunk and proximal peroneal artery with a 4 mm diameter Lutonix drug-coated angioplasty balloon             5.  Viabahn stent placement to the right tibioperoneal trunk and most distal popliteal artery with 5 mm diameter by 7.5 cm length stent for residual stenosis and thrombus after angioplasty             6.  Percutaneous transluminal angioplasty of the right posterior tibial artery with 2.5 mm diameter by 15 cm length angioplasty balloon             7.  StarClose closure device left femoral artery   The patient notes improvement in the lower extremity symptoms. No interval shortening of the patient's claudication distance or rest pain symptoms. Previous wounds have now healed.  No new ulcers or wounds have occurred since the last visit.  There have been no significant changes to the patient's overall health care.  The patient denies amaurosis fugax or recent TIA symptoms. There are no recent neurological changes noted. The patient denies history of DVT, PE or superficial thrombophlebitis. The patient denies recent episodes of angina or shortness of breath.   ABI's Rt=0.99 and Lt=0.70  (previous ABI's Rt=1.10 and  Lt=0.73) Duplex US of the right lower extremity shows monophasic/biphasic waveforms with monophasic waveforms in the left lower extremity.  The patient has good toe waveforms bilaterally.   Review of Systems  Neurological:  Positive for numbness.  All other systems reviewed and are negative.     Objective:   Physical Exam Vitals reviewed.  HENT:     Head: Normocephalic.  Cardiovascular:     Rate and Rhythm: Normal rate.     Pulses:          Dorsalis pedis pulses are 1+ on the right side and 1+ on the left side.       Posterior tibial pulses are 1+ on the right side and 1+ on the left side.  Pulmonary:     Effort: Pulmonary effort is normal.  Skin:    General: Skin is warm and dry.  Neurological:     Mental Status: She is alert and oriented to person, place, and time. Mental status is at baseline.  Psychiatric:        Mood and Affect: Mood normal.        Behavior: Behavior normal.        Thought Content: Thought content normal.  Judgment: Judgment normal.    BP (!) 151/78   Pulse 70   Ht '5\' 4"'$  (1.626 m)   Wt 160 lb (72.6 kg)   BMI 27.46 kg/m   History reviewed. No pertinent past medical history.  Social History   Socioeconomic History   Marital status: Single    Spouse name: Not on file   Number of children: Not on file   Years of education: Not on file   Highest education level: Not on file  Occupational History   Not on file  Tobacco Use   Smoking status: Every Day   Smokeless tobacco: Never  Substance and Sexual Activity   Alcohol use: Never   Drug use: Never   Sexual activity: Not on file  Other Topics Concern   Not on file  Social History Narrative   Not on file   Social Determinants of Health   Financial Resource Strain: Not on file  Food Insecurity: Not on file  Transportation Needs: Not on file  Physical Activity: Not on file  Stress: Not on file  Social Connections: Not on file  Intimate Partner Violence: Not on file    Past  Surgical History:  Procedure Laterality Date   APPENDECTOMY     LOWER EXTREMITY ANGIOGRAPHY Right 03/01/2021   Procedure: LOWER EXTREMITY ANGIOGRAPHY;  Surgeon: Algernon Huxley, MD;  Location: [facility];  Service: Cardiovascular;  Laterality: Right;   LOWER EXTREMITY ANGIOGRAPHY Right 03/01/2021   Procedure: Lower Extremity Angiography;  Surgeon: Katha Cabal, MD;  Location: [facility];  Service: Cardiovascular;  Laterality: Right;   LOWER EXTREMITY ANGIOGRAPHY Right 07/01/2021   Procedure: LOWER EXTREMITY ANGIOGRAPHY;  Surgeon: Shaune Spittle, MD;  Location: [facility];  Service: Cardiovascular;  Laterality: Right;   LOWER EXTREMITY INTERVENTION Right 07/02/2021   Procedure: LOWER EXTREMITY INTERVENTION;  Surgeon: Algernon Huxley, MD;  Location: [facility];  Service: Cardiovascular;  Laterality: Right;    History reviewed. No pertinent family history.  Allergies  Allergen Reactions   Codeine Nausea And Vomiting   Oxycodone-Acetaminophen Nausea And Vomiting   Sulfa Antibiotics Nausea And Vomiting    CBC Latest Ref Rng & Units 07/03/2021 07/02/2021 07/02/2021  WBC 4.0 - 10.5 K/uL 11.7(H) 11.2(H) 9.9  Hemoglobin 12.0 - 15.0 g/dL 12.3 13.3 13.5  Hematocrit 36.0 - 46.0 % 36.5 38.6 39.5  Platelets 150 - 400 K/uL 183 194 197      CMP     Component Value Date/Time   NA 136 07/03/2021 0921   K 3.8 07/03/2021 0921   CL 104 07/03/2021 0921   CO2 24 07/03/2021 0921   GLUCOSE 101 (H) 07/03/2021 0921   BUN 6 (L) 07/03/2021 0921   CREATININE 0.76 07/03/2021 0921   CALCIUM 8.4 (L) 07/03/2021 0921   GFRNONAA >60 07/03/2021 0921     No results found.     Assessment & Plan:   1. Atherosclerosis of native arteries of the extremities with ulceration ([place])  Recommend:  The patient has evidence of atherosclerosis of the lower extremities with claudication.  The patient does not voice lifestyle limiting changes at this point in time.  Noninvasive  studies do not suggest clinically significant change.  No invasive studies, angiography or surgery at this time The patient should continue walking and begin a more formal exercise program.  The patient should continue antiplatelet therapy and aggressive treatment of the lipid abnormalities  No changes in the patient's medications at this time  The patient should continue wearing graduated compression socks 10-15 mmHg strength to control the mild edema.    2. Hyperlipidemia, unspecified hyperlipidemia type Continue statin as ordered and reviewed, no changes at this time   3. Tobacco use The patient has stopped smoking.  The patient is congratulated and encouraged her maintain abstinence.   Current Outpatient Medications on File Prior to Visit  Medication Sig Dispense Refill   albuterol (VENTOLIN HFA) 108 (90 Base) MCG/ACT inhaler Inhale 2 puffs into the lungs every 4 (four) hours as needed for wheezing or shortness of breath.     apixaban (ELIQUIS) 2.5 MG TABS tablet Take 1 tablet (2.5 mg total) by mouth 2 (two) times daily. 60 tablet 4   apixaban (ELIQUIS) 5 MG TABS tablet Take 1 tablet (5 mg total) by mouth 2 (two) times daily. 60 tablet 5   aspirin EC 81 MG EC tablet Take 1 tablet (81 mg total) by mouth daily. Swallow whole. 30 tablet 11   atorvastatin (LIPITOR) 10 MG tablet Take 1 tablet (10 mg total) by mouth daily. 30 tablet 11   lamoTRIgine (LAMICTAL) 200 MG tablet Take 400 mg by mouth at bedtime.     QUEtiapine (SEROQUEL) 200 MG tablet Take 400 mg by mouth at bedtime.     traMADol (ULTRAM) 50 MG tablet Take 1 tablet (50 mg total) by mouth every 6 (six) hours as needed. 20 tablet 0   No current facility-administered medications on file prior to visit.    There are no Patient Instructions on file for this visit. No follow-ups on file.   Kris Hartmann, NP

## 2021-10-15 ENCOUNTER — Other Ambulatory Visit (INDEPENDENT_AMBULATORY_CARE_PROVIDER_SITE_OTHER): Payer: Self-pay | Admitting: Nurse Practitioner

## 2021-10-15 DIAGNOSIS — I739 Peripheral vascular disease, unspecified: Secondary | ICD-10-CM

## 2021-10-16 ENCOUNTER — Encounter (INDEPENDENT_AMBULATORY_CARE_PROVIDER_SITE_OTHER): Payer: Self-pay | Admitting: Vascular Surgery

## 2021-10-16 ENCOUNTER — Ambulatory Visit (INDEPENDENT_AMBULATORY_CARE_PROVIDER_SITE_OTHER): Payer: Medicare PPO | Admitting: Vascular Surgery

## 2021-10-16 ENCOUNTER — Other Ambulatory Visit: Payer: Self-pay

## 2021-10-16 ENCOUNTER — Ambulatory Visit (INDEPENDENT_AMBULATORY_CARE_PROVIDER_SITE_OTHER): Payer: Medicare PPO

## 2021-10-16 VITALS — BP 154/71 | HR 77 | Ht 65.0 in | Wt 171.0 lb

## 2021-10-16 DIAGNOSIS — I7025 Atherosclerosis of native arteries of other extremities with ulceration: Secondary | ICD-10-CM | POA: Diagnosis not present

## 2021-10-16 DIAGNOSIS — Z720 Tobacco use: Secondary | ICD-10-CM | POA: Diagnosis not present

## 2021-10-16 DIAGNOSIS — I739 Peripheral vascular disease, unspecified: Secondary | ICD-10-CM | POA: Diagnosis not present

## 2021-10-16 DIAGNOSIS — E785 Hyperlipidemia, unspecified: Secondary | ICD-10-CM

## 2021-10-16 DIAGNOSIS — Z9889 Other specified postprocedural states: Secondary | ICD-10-CM | POA: Diagnosis not present

## 2021-10-16 NOTE — Assessment & Plan Note (Signed)
Wound is now healed and symptoms are markedly improved after second revascularization procedure about 3 months ago. Her ABIs today are normal at 0.98 on the right and 0.91 on the left with biphasic waveforms.  Continue aspirin, Eliquis, and statin agent.  Patient understands the importance of smoking cessation and the durability will be poor if she continues to smoke.  Return to clinic in 3 months with noninvasive studies due to the high risk nature of her situation with an early reocclusion previously and limited runoff.

## 2021-10-16 NOTE — Assessment & Plan Note (Signed)

## 2021-10-16 NOTE — Assessment & Plan Note (Signed)
lipid control important in reducing the progression of atherosclerotic disease. Continue statin therapy  

## 2021-10-16 NOTE — Progress Notes (Signed)
MRN : [id number]  Kristen Frazier is a 65 y.o. (Apr 18, 1956) female who presents with chief complaint of  Chief Complaint  Patient presents with   Follow-up    3 mo ABI  .  History of Present Illness: Patient returns today in follow up of her PAD.  She is about 3 months status post her second right lower extremity intervention which included thrombolytic therapy and extensive revascularization for rest pain.  This has resulted in marked improvement and she says her legs are doing great.  She is not currently having any significant claudication symptoms, ischemic rest pain, or ulceration.  She denies any issues or problems since her last visit.  She is continuing to smoke but understands how deleterious this is on her vascular system and says she is going to try to quit.  Her ABIs today are normal at 0.98 on the right and 0.91 on the left with biphasic waveforms.   Current Outpatient Medications  Medication Sig Dispense Refill   albuterol (VENTOLIN HFA) 108 (90 Base) MCG/ACT inhaler Inhale 2 puffs into the lungs every 4 (four) hours as needed for wheezing or shortness of breath.     apixaban (ELIQUIS) 2.5 MG TABS tablet Take 1 tablet (2.5 mg total) by mouth 2 (two) times daily. 60 tablet 4   apixaban (ELIQUIS) 5 MG TABS tablet Take 1 tablet (5 mg total) by mouth 2 (two) times daily. 60 tablet 5   aspirin EC 81 MG EC tablet Take 1 tablet (81 mg total) by mouth daily. Swallow whole. 30 tablet 11   atorvastatin (LIPITOR) 10 MG tablet Take 1 tablet (10 mg total) by mouth daily. 30 tablet 11   lamoTRIgine (LAMICTAL) 200 MG tablet Take 400 mg by mouth at bedtime.     QUEtiapine (SEROQUEL) 200 MG tablet Take 400 mg by mouth at bedtime.     traMADol (ULTRAM) 50 MG tablet Take 1 tablet (50 mg total) by mouth every 6 (six) hours as needed. 20 tablet 0   No current facility-administered medications for this visit.    No past medical history on file.  Past Surgical History:  Procedure Laterality  Date   APPENDECTOMY     LOWER EXTREMITY ANGIOGRAPHY Right 03/01/2021   Procedure: LOWER EXTREMITY ANGIOGRAPHY;  Surgeon: Algernon Huxley, MD;  Location: [facility];  Service: Cardiovascular;  Laterality: Right;   LOWER EXTREMITY ANGIOGRAPHY Right 03/01/2021   Procedure: Lower Extremity Angiography;  Surgeon: Katha Cabal, MD;  Location: [facility];  Service: Cardiovascular;  Laterality: Right;   LOWER EXTREMITY ANGIOGRAPHY Right 07/01/2021   Procedure: LOWER EXTREMITY ANGIOGRAPHY;  Surgeon: Shaune Spittle, MD;  Location: [facility];  Service: Cardiovascular;  Laterality: Right;   LOWER EXTREMITY INTERVENTION Right 07/02/2021   Procedure: LOWER EXTREMITY INTERVENTION;  Surgeon: Algernon Huxley, MD;  Location: [facility];  Service: Cardiovascular;  Laterality: Right;   Social History        Tobacco Use   Smoking status: Current Every Day Smoker   Smokeless tobacco: Never Used  Substance Use Topics   Alcohol use: Never   Drug use: Never        Family History No bleeding disorders, clotting disorders, autoimmune diseases, or aneurysms       Allergies  Allergen Reactions   Codeine Nausea And Vomiting   Oxycodone-Acetaminophen Nausea And Vomiting   Sulfa Antibiotics Nausea And Vomiting        REVIEW OF SYSTEMS (Negative unless  checked)   Constitutional: [] Weight loss  [] Fever  [] Chills Cardiac: [] Chest pain   [] Chest pressure   [] Palpitations   [] Shortness of breath when laying flat   [] Shortness of breath at rest   [] Shortness of breath with exertion. Vascular:  [] Pain in legs with walking   [] Pain in legs at rest   [] Pain in legs when laying flat   [] Claudication   [x] Pain in feet when walking  [x] Pain in feet at rest  [x] Pain in feet when laying flat   [] History of DVT   [] Phlebitis   [] Swelling in legs   [] Varicose veins   [x] Non-healing ulcers Pulmonary:   [] Uses home oxygen   [] Productive cough   [] Hemoptysis   [] Wheeze  [] COPD    [] Asthma Neurologic:  [] Dizziness  [] Blackouts   [x] Seizures   [] History of stroke   [] History of TIA  [] Aphasia   [] Temporary blindness   [] Dysphagia   [] Weakness or numbness in arms   [] Weakness or numbness in legs Musculoskeletal:  [] Arthritis   [] Joint swelling   [] Joint pain   [] Low back pain Hematologic:  [] Easy bruising  [] Easy bleeding   [] Hypercoagulable state   [] Anemic  [] Hepatitis Gastrointestinal:  [] Blood in stool   [] Vomiting blood  [] Gastroesophageal reflux/heartburn   [] Abdominal pain Genitourinary:  [] Chronic kidney disease   [] Difficult urination  [] Frequent urination  [] Burning with urination   [] Hematuria Skin:  [] Rashes   [x] Ulcers   [x] Wounds Psychological:  [] History of anxiety   [x]  History of major depression.     Physical Examination  BP (!) 154/71   Pulse 77   Ht 5\' 5"  (1.651 m)   Wt 171 lb (77.6 kg)   BMI 28.46 kg/m  Gen:  WD/WN, NAD Head: [place]/AT, No temporalis wasting. Ear/Nose/Throat: Hearing grossly intact, nares w/o erythema or drainage Eyes: Conjunctiva clear. Sclera non-icteric Neck: Supple.  Trachea midline Pulmonary:  Good air movement, no use of accessory muscles.  Cardiac: RRR, no JVD Vascular:  Vessel Right Left  Radial Palpable Palpable                          PT Palpable Palpable  DP Palpable Palpable   Musculoskeletal: M/S 5/5 throughout.  No deformity or atrophy.  No significant lower extremity edema. Neurologic: Sensation grossly intact in extremities.  Symmetrical.  Speech is fluent.  Psychiatric: Judgment intact, Mood & affect appropriate for pt's clinical situation. Dermatologic: No rashes or ulcers noted.  No cellulitis or open wounds.      Labs No results found for this or any previous visit (from the past 2160 hour(s)).  Radiology No results found.  Assessment/Plan  Atherosclerosis of native arteries of the extremities with ulceration ([place]) Wound is now healed and symptoms are markedly improved after second  revascularization procedure about 3 months ago. Her ABIs today are normal at 0.98 on the right and 0.91 on the left with biphasic waveforms.  Continue aspirin, Eliquis, and statin agent.  Patient understands the importance of smoking cessation and the durability will be poor if she continues to smoke.  Return to clinic in 3 months with noninvasive studies due to the high risk nature of her situation with an early reocclusion previously and limited runoff.  HLD (hyperlipidemia) lipid control important in reducing the progression of atherosclerotic disease. Continue statin therapy   Tobacco use We had a discussion for approximately 3 minutes regarding the absolute need for smoking cessation due to the deleterious nature of tobacco on  the vascular system. We discussed the tobacco use would diminish patency of any intervention, and likely significantly worsen progressio of disease. We discussed multiple agents for quitting including replacement therapy or medications to reduce cravings such as Chantix. The patient voices their understanding of the importance of smoking cessation.    Leotis Pain, MD  10/16/2021 10:36 AM    This note was created with Dragon medical transcription system.  Any errors from dictation are purely unintentional

## 2022-01-14 ENCOUNTER — Other Ambulatory Visit (INDEPENDENT_AMBULATORY_CARE_PROVIDER_SITE_OTHER): Payer: Self-pay | Admitting: Vascular Surgery

## 2022-01-14 DIAGNOSIS — I7025 Atherosclerosis of native arteries of other extremities with ulceration: Secondary | ICD-10-CM

## 2022-01-15 ENCOUNTER — Ambulatory Visit (INDEPENDENT_AMBULATORY_CARE_PROVIDER_SITE_OTHER): Payer: Medicare (Managed Care)

## 2022-01-15 ENCOUNTER — Encounter (INDEPENDENT_AMBULATORY_CARE_PROVIDER_SITE_OTHER): Payer: Self-pay | Admitting: Nurse Practitioner

## 2022-01-15 ENCOUNTER — Ambulatory Visit (INDEPENDENT_AMBULATORY_CARE_PROVIDER_SITE_OTHER): Payer: Medicare (Managed Care) | Admitting: Nurse Practitioner

## 2022-01-15 ENCOUNTER — Other Ambulatory Visit: Payer: Self-pay

## 2022-01-15 VITALS — BP 155/74 | HR 72 | Resp 17 | Ht 64.0 in | Wt 173.0 lb

## 2022-01-15 DIAGNOSIS — I739 Peripheral vascular disease, unspecified: Secondary | ICD-10-CM

## 2022-01-15 DIAGNOSIS — Z720 Tobacco use: Secondary | ICD-10-CM

## 2022-01-15 DIAGNOSIS — I7025 Atherosclerosis of native arteries of other extremities with ulceration: Secondary | ICD-10-CM

## 2022-01-15 DIAGNOSIS — E785 Hyperlipidemia, unspecified: Secondary | ICD-10-CM | POA: Diagnosis not present

## 2022-01-15 DIAGNOSIS — Z9889 Other specified postprocedural states: Secondary | ICD-10-CM | POA: Diagnosis not present

## 2022-01-26 ENCOUNTER — Encounter (INDEPENDENT_AMBULATORY_CARE_PROVIDER_SITE_OTHER): Payer: Self-pay | Admitting: Nurse Practitioner

## 2022-01-26 NOTE — Progress Notes (Signed)
Subjective:    Patient ID: Kristen Frazier, female    DOB: 22-Feb-1956, 66 y.o.   MRN: [id number] Chief Complaint  Patient presents with   Follow-up    ultrasound    The patient returns to the office for followup and review of the noninvasive studies. There have been no interval changes in lower extremity symptoms. No interval shortening of the patient's claudication distance or development of rest pain symptoms. No new ulcers or wounds have occurred since the last visit.  There have been no significant changes to the patient's overall health care.  The patient denies amaurosis fugax or recent TIA symptoms. There are no recent neurological changes noted. The patient denies history of DVT, PE or superficial thrombophlebitis. The patient denies recent episodes of angina or shortness of breath.   ABI Rt=1.09 and Lt=0.96  (previous ABI's Rt=0.98 and Lt=0.91) Duplex ultrasound of the bilateral tibial arteries reveals biphasic waveforms with normal toe waveforms on the left with slightly dampened on the right.  Additional lower extremity arterial duplex shows biphasic waveforms throughout the bilateral lower extremities with open and patent stents.   Review of Systems     Objective:   Physical Exam  BP (!) 155/74 (BP Location: Left Arm)    Pulse 72    Resp 17    Ht '5\' 4"'$  (1.626 m)    Wt 173 lb (78.5 kg)    BMI 29.70 kg/m   History reviewed. No pertinent past medical history.  Social History   Socioeconomic History   Marital status: Single    Spouse name: Not on file   Number of children: Not on file   Years of education: Not on file   Highest education level: Not on file  Occupational History   Not on file  Tobacco Use   Smoking status: Every Day   Smokeless tobacco: Never  Substance and Sexual Activity   Alcohol use: Never   Drug use: Never   Sexual activity: Not on file  Other Topics Concern   Not on file  Social History Narrative   Not on file   Social Determinants  of Health   Financial Resource Strain: Not on file  Food Insecurity: Not on file  Transportation Needs: Not on file  Physical Activity: Not on file  Stress: Not on file  Social Connections: Not on file  Intimate Partner Violence: Not on file    Past Surgical History:  Procedure Laterality Date   APPENDECTOMY     LOWER EXTREMITY ANGIOGRAPHY Right 03/01/2021   Procedure: LOWER EXTREMITY ANGIOGRAPHY;  Surgeon: Algernon Huxley, MD;  Location: [facility];  Service: Cardiovascular;  Laterality: Right;   LOWER EXTREMITY ANGIOGRAPHY Right 03/01/2021   Procedure: Lower Extremity Angiography;  Surgeon: Katha Cabal, MD;  Location: [facility];  Service: Cardiovascular;  Laterality: Right;   LOWER EXTREMITY ANGIOGRAPHY Right 07/01/2021   Procedure: LOWER EXTREMITY ANGIOGRAPHY;  Surgeon: Shaune Spittle, MD;  Location: [facility];  Service: Cardiovascular;  Laterality: Right;   LOWER EXTREMITY INTERVENTION Right 07/02/2021   Procedure: LOWER EXTREMITY INTERVENTION;  Surgeon: Algernon Huxley, MD;  Location: [facility];  Service: Cardiovascular;  Laterality: Right;    History reviewed. No pertinent family history.  Allergies  Allergen Reactions   Codeine Nausea And Vomiting   Oxycodone-Acetaminophen Nausea And Vomiting   Sulfa Antibiotics Nausea And Vomiting    CBC Latest Ref Rng & Units 07/03/2021 07/02/2021 07/02/2021  WBC 4.0 - 10.5  K/uL 11.7(H) 11.2(H) 9.9  Hemoglobin 12.0 - 15.0 g/dL 12.3 13.3 13.5  Hematocrit 36.0 - 46.0 % 36.5 38.6 39.5  Platelets 150 - 400 K/uL 183 194 197      CMP     Component Value Date/Time   NA 136 07/03/2021 0921   K 3.8 07/03/2021 0921   CL 104 07/03/2021 0921   CO2 24 07/03/2021 0921   GLUCOSE 101 (H) 07/03/2021 0921   BUN 6 (L) 07/03/2021 0921   CREATININE 0.76 07/03/2021 0921   CALCIUM 8.4 (L) 07/03/2021 0921   GFRNONAA >60 07/03/2021 0921     VAS [place] ABI WITH/WO TBI  Result Date: 01/15/2022  LOWER  EXTREMITY DOPPLER STUDY Patient Name:  Kristen Frazier  Date of Exam:   01/15/2022 Medical Rec #: [id number]       Accession #:    [id number] Date of Birth: [birth year]      Patient Gender: F Patient Age:   71 years Exam Location:  [facility] Procedure:      VAS [place] ABI WITH/WO TBI Referring Phys: --------------------------------------------------------------------------------   Vascular Interventions: 03/01/2021 PTA of L CIAA, Rt tibioperoneal trunk, Rt SFA                         and popliteal aretry. Stent Rt EIA and distal Rt and Lt                         CIA, Rt popliteal artery.                         07/02/2021 Mechanical thrombectomy of Rt SFA, popliteal,                         tibioperoneal trunk and peroneal arteries. PTA of                         tibioperoneal trunk, proximal peroneal and PTA. Stent of                         Rt tibioperoneal trunk and distal popliteal.[id number]. Performing Technologist: Concha Norway RVT  Examination Guidelines: A complete evaluation includes at minimum, Doppler waveform signals and systolic blood pressure reading at the level of bilateral brachial, anterior tibial, and posterior tibial arteries, when vessel segments are accessible. Bilateral testing is considered an integral part of a complete examination. Photoelectric Plethysmograph (PPG) waveforms and toe systolic pressure readings are included as required and additional duplex testing as needed. Limited examinations for reoccurring indications may be performed as noted.  ABI Findings: +---------+------------------+-----+--------+--------+  Right     Rt Pressure (mmHg) Index Waveform Comment   +---------+------------------+-----+--------+--------+  Brachial  138                                         +---------+------------------+-----+--------+--------+  ATA       146                1.06  biphasic           +---------+------------------+-----+--------+--------+  PTA       151                1.09  biphasic            +---------+------------------+-----+--------+--------+  Great Toe 96                 0.70  Abnormal           +---------+------------------+-----+--------+--------+ +---------+------------------+-----+--------+-------+  Left      Lt Pressure (mmHg) Index Waveform Comment  +---------+------------------+-----+--------+-------+  Brachial  136                                        +---------+------------------+-----+--------+-------+  ATA       132                0.96  biphasic          +---------+------------------+-----+--------+-------+  PTA       131                0.95  biphasic          +---------+------------------+-----+--------+-------+  Great Toe 96                 0.70  Normal            +---------+------------------+-----+--------+-------+ +-------+-----------+-----------+------------+------------+  ABI/TBI Today's ABI Today's TBI Previous ABI Previous TBI  +-------+-----------+-----------+------------+------------+  Right   1.09        .70         .98          .63           +-------+-----------+-----------+------------+------------+  Left    .96         .70         .91          .63           +-------+-----------+-----------+------------+------------+  Left ABIs and TBIs appear increased compared to prior study on 06/2021.  Summary: Right: Resting right ankle-brachial index is within normal range. No evidence of significant right lower extremity arterial disease. The right toe-brachial index is normal. Left: Resting left ankle-brachial index is within normal range. No evidence of significant left lower extremity arterial disease. The left toe-brachial index is normal.  *See table(s) above for measurements and observations.  Electronically signed by Leotis Pain MD on 01/15/2022 at 3:54:22 PM.    Final        Assessment & Plan:   1. Peripheral arterial disease with history of revascularization (HCC)  Recommend:  The patient has evidence of atherosclerosis of the lower extremities with claudication.   The patient does not voice lifestyle limiting changes at this point in time.  Noninvasive studies do not suggest clinically significant change.  No invasive studies, angiography or surgery at this time The patient should continue walking and begin a more formal exercise program.  The patient should continue antiplatelet therapy and aggressive treatment of the lipid abnormalities  No changes in the patient's medications at this time  The patient should continue wearing graduated compression socks 10-15 mmHg strength to control the mild edema.    2. Hyperlipidemia, unspecified hyperlipidemia type Continue statin as ordered and reviewed, no changes at this time   3. Tobacco use Smoking cessation was discussed, 3-10 minutes spent on this topic specifically    Current Outpatient Medications on File Prior to Visit  Medication Sig Dispense Refill   albuterol (VENTOLIN HFA) 108 (90 Base) MCG/ACT inhaler Inhale 2 puffs into the lungs every 4 (four) hours as needed for wheezing or shortness of breath.     apixaban (ELIQUIS) 5 MG  TABS tablet Take 1 tablet (5 mg total) by mouth 2 (two) times daily. 60 tablet 5   aspirin EC 81 MG EC tablet Take 1 tablet (81 mg total) by mouth daily. Swallow whole. 30 tablet 11   lamoTRIgine (LAMICTAL) 200 MG tablet Take 400 mg by mouth at bedtime.     QUEtiapine (SEROQUEL) 200 MG tablet Take 400 mg by mouth at bedtime.     apixaban (ELIQUIS) 2.5 MG TABS tablet Take 1 tablet (2.5 mg total) by mouth 2 (two) times daily. (Patient not taking: Reported on 01/15/2022) 60 tablet 4   atorvastatin (LIPITOR) 10 MG tablet Take 1 tablet (10 mg total) by mouth daily. (Patient not taking: Reported on 01/15/2022) 30 tablet 11   traMADol (ULTRAM) 50 MG tablet Take 1 tablet (50 mg total) by mouth every 6 (six) hours as needed. (Patient not taking: Reported on 01/15/2022) 20 tablet 0   No current facility-administered medications on file prior to visit.    There are no Patient  Instructions on file for this visit. No follow-ups on file.   Kris Hartmann, NP

## 2022-02-28 ENCOUNTER — Other Ambulatory Visit (INDEPENDENT_AMBULATORY_CARE_PROVIDER_SITE_OTHER): Payer: Self-pay | Admitting: Vascular Surgery

## 2022-04-01 ENCOUNTER — Telehealth (INDEPENDENT_AMBULATORY_CARE_PROVIDER_SITE_OTHER): Payer: Self-pay

## 2022-04-01 NOTE — Telephone Encounter (Signed)
Patient called in stating her foot has turned white and cold to touch. I advised the patient that we didn't have any appointments and after speaking with my manger, patient needed to either contact her PCP or immediately go to the ED  ? ?Patient understood  ?

## 2022-07-11 ENCOUNTER — Other Ambulatory Visit (INDEPENDENT_AMBULATORY_CARE_PROVIDER_SITE_OTHER): Payer: Self-pay | Admitting: Nurse Practitioner

## 2022-07-11 DIAGNOSIS — Z9889 Other specified postprocedural states: Secondary | ICD-10-CM

## 2022-07-16 ENCOUNTER — Ambulatory Visit (INDEPENDENT_AMBULATORY_CARE_PROVIDER_SITE_OTHER): Payer: Medicare (Managed Care) | Admitting: Vascular Surgery

## 2022-07-16 ENCOUNTER — Encounter (INDEPENDENT_AMBULATORY_CARE_PROVIDER_SITE_OTHER): Payer: Medicare (Managed Care)

## 2024-08-20 ENCOUNTER — Ambulatory Visit
Admission: EM | Admit: 2024-08-20 | Discharge: 2024-08-20 | Disposition: A | Discharge status: Home / Self Care | Payer: Medicare (Managed Care)

## 2024-08-20 ENCOUNTER — Encounter: Payer: Self-pay | Admitting: Emergency Medicine

## 2024-08-20 DIAGNOSIS — E785 Hyperlipidemia, unspecified: Secondary | ICD-10-CM | POA: Insufficient documentation

## 2024-08-20 DIAGNOSIS — Z86718 Personal history of other venous thrombosis and embolism: Secondary | ICD-10-CM | POA: Diagnosis not present

## 2024-08-20 DIAGNOSIS — K2970 Gastritis, unspecified, without bleeding: Secondary | ICD-10-CM | POA: Insufficient documentation

## 2024-08-20 DIAGNOSIS — R9431 Abnormal electrocardiogram [ECG] [EKG]: Secondary | ICD-10-CM | POA: Diagnosis not present

## 2024-08-20 DIAGNOSIS — E871 Hypo-osmolality and hyponatremia: Secondary | ICD-10-CM | POA: Diagnosis not present

## 2024-08-20 DIAGNOSIS — K2990 Gastroduodenitis, unspecified, without bleeding: Secondary | ICD-10-CM | POA: Diagnosis not present

## 2024-08-20 DIAGNOSIS — Z7901 Long term (current) use of anticoagulants: Secondary | ICD-10-CM | POA: Diagnosis not present

## 2024-08-20 DIAGNOSIS — F319 Bipolar disorder, unspecified: Secondary | ICD-10-CM | POA: Insufficient documentation

## 2024-08-20 DIAGNOSIS — R12 Heartburn: Secondary | ICD-10-CM | POA: Diagnosis present

## 2024-08-20 DIAGNOSIS — F17210 Nicotine dependence, cigarettes, uncomplicated: Secondary | ICD-10-CM | POA: Insufficient documentation

## 2024-08-20 HISTORY — DX: Essential (primary) hypertension: I10

## 2024-08-20 HISTORY — DX: Acute embolism and thrombosis of unspecified deep veins of unspecified lower extremity: I82.409

## 2024-08-20 LAB — COMPREHENSIVE METABOLIC PANEL WITH GFR
ALT: 13 U/L (ref 0–44)
AST: 17 U/L (ref 15–41)
Albumin: 3.7 g/dL (ref 3.5–5.0)
Alkaline Phosphatase: 81 U/L (ref 38–126)
Anion gap: 13 (ref 5–15)
BUN: 15 mg/dL (ref 8–23)
CO2: 24 mmol/L (ref 22–32)
Calcium: 9.6 mg/dL (ref 8.9–10.3)
Chloride: 97 mmol/L — ABNORMAL LOW (ref 98–111)
Creatinine, Ser: 1.07 mg/dL — ABNORMAL HIGH (ref 0.44–1.00)
GFR, Estimated: 56 mL/min — ABNORMAL LOW (ref >60)
Glucose, Bld: 107 mg/dL — ABNORMAL HIGH (ref 70–99)
Potassium: 4 mmol/L (ref 3.5–5.1)
Sodium: 134 mmol/L — ABNORMAL LOW (ref 135–145)
Total Bilirubin: 1.1 mg/dL (ref 0.0–1.2)
Total Protein: 7.4 g/dL (ref 6.5–8.1)

## 2024-08-20 LAB — CBC WITH DIFFERENTIAL/PLATELET
Abs Immature Granulocytes: 0.05 K/uL (ref 0.00–0.07)
Basophils Absolute: 0.1 K/uL (ref 0.0–0.1)
Basophils Relative: 1 %
Eosinophils Absolute: 0.2 K/uL (ref 0.0–0.5)
Eosinophils Relative: 2 %
HCT: 42.7 % (ref 36.0–46.0)
Hemoglobin: 14.6 g/dL (ref 12.0–15.0)
Immature Granulocytes: 1 %
Lymphocytes Relative: 14 %
Lymphs Abs: 1.4 K/uL (ref 0.7–4.0)
MCH: 33.1 pg (ref 26.0–34.0)
MCHC: 34.2 g/dL (ref 30.0–36.0)
MCV: 96.8 fL (ref 80.0–100.0)
Monocytes Absolute: 0.8 K/uL (ref 0.1–1.0)
Monocytes Relative: 8 %
Neutro Abs: 7.1 K/uL (ref 1.7–7.7)
Neutrophils Relative %: 74 %
Platelets: 295 K/uL (ref 150–400)
RBC: 4.41 MIL/uL (ref 3.87–5.11)
RDW: 14.1 % (ref 11.5–15.5)
WBC: 9.6 K/uL (ref 4.0–10.5)
nRBC: 0 % (ref 0.0–0.2)

## 2024-08-20 LAB — LIPASE, BLOOD: Lipase: 19 U/L (ref 11–51)

## 2024-08-20 MED ORDER — LIDOCAINE VISCOUS HCL 2 % MT SOLN
15.0000 mL | Freq: Once | OROMUCOSAL | Status: AC
  Administered 2024-08-20: 15 mL via OROMUCOSAL

## 2024-08-20 MED ORDER — ONDANSETRON 4 MG PO TBDP
4.0000 mg | ORAL_TABLET | Freq: Once | ORAL | Status: AC
  Administered 2024-08-20: 4 mg via ORAL

## 2024-08-20 MED ORDER — ALUM & MAG HYDROXIDE-SIMETH 200-200-20 MG/5ML PO SUSP
30.0000 mL | Freq: Once | ORAL | Status: AC
  Administered 2024-08-20: 30 mL via ORAL

## 2024-08-20 MED ORDER — PANTOPRAZOLE SODIUM 20 MG PO TBEC: 20.0000 mg | DELAYED_RELEASE_TABLET | Freq: Every day | ORAL | 1 refills | Status: AC

## 2024-08-20 MED ORDER — ONDANSETRON 4 MG PO TBDP: 4.0000 mg | ORAL_TABLET | Freq: Four times a day (QID) | ORAL | 0 refills | Status: AC | PRN

## 2024-08-20 NOTE — Discharge Instructions (Addendum)
 Stop taking your omeprazole and begin taking the pantoprazole 20 mg once daily to help control the stomach acid production and help control the inflammation in your stomach.  Along with the pantoprazole and when to take 20 mg of over-the-counter Pepcid  twice daily.  This will further decrease the acid production and help decrease stomach inflammation.  You may use the Zofran  every 6 hours as needed for nausea or vomiting.  Stopping smoking can be very helpful in preventing recurrence of gastritis or reflux disease.  Avoid fried, spicy, or greasy foods as these can all increase stomach irritation and increase your pain.  If you have a return of your symptoms you may feel free to return for reevaluation.  If you develop vomiting uncontrolled by the Zofran , start passing blood in your vomit or have blood in your stool, or fever you need to go to the ER for evaluation.

## 2024-08-20 NOTE — ED Provider Notes (Addendum)
 [facility]    CSN: [id number] Arrival date & time: 08/20/24  1157      History   Chief Complaint Chief Complaint  Patient presents with   Emesis   Heartburn    HPI Kristen Frazier is a 68 y.o. female.   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  She reports that she has pain in her epigastrium and a burning sensation upper esophagus into the back of her throat.  She reports that it does make her nauseous and she feels like if she vomits she can get some relief.  She has not had any relief with omeprazole, Pepto-Bismol, or Rolaids.  She denies any shortness of breath.  She is typically a half and pack-a-day smoker but reports that she has had 3 cigarettes in the last 3 days.  She has also had a decreased appetite.  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   HLD (hyperlipidemia) 07/01/2021   Nontraumatic ischemic infarction of muscle of right lower leg 07/01/2021   Limb ischemia 03/01/2021   Atherosclerosis of native arteries of the extremities with ulceration (HCC) 02/20/2021   Tobacco use 07/14/2017   Bipolar affective disorder (HCC) 07/17/2013   Colitis 07/17/2013    Past Surgical History:  Procedure Laterality Date   APPENDECTOMY     LOWER EXTREMITY ANGIOGRAPHY Right 03/01/2021   Procedure: LOWER EXTREMITY ANGIOGRAPHY;  Surgeon: Marea Selinda RAMAN, MD;  Location: [facility];  Service: Cardiovascular;  Laterality: Right;   LOWER EXTREMITY ANGIOGRAPHY Right 03/01/2021   Procedure: Lower Extremity Angiography;  Surgeon: Jama Cordella MATSU, MD;  Location: [facility];   Service: Cardiovascular;  Laterality: Right;   LOWER EXTREMITY ANGIOGRAPHY Right 07/01/2021   Procedure: LOWER EXTREMITY ANGIOGRAPHY;  Surgeon: Juline Lunger, MD;  Location: [facility];  Service: Cardiovascular;  Laterality: Right;   LOWER EXTREMITY INTERVENTION Right 07/02/2021   Procedure: LOWER EXTREMITY INTERVENTION;  Surgeon: Marea Selinda RAMAN, MD;  Location: [facility];  Service: Cardiovascular;  Laterality: Right;    OB History   No obstetric history on file.      Home Medications    Prior to Admission medications   Medication Sig Start Date End Date Taking? Authorizing Provider  apixaban  (ELIQUIS ) 5 MG TABS tablet Take 1 tablet (5 mg total) by mouth 2 (two) times daily. 07/03/21  Yes Marea Selinda RAMAN, MD  aspirin  EC 81 MG EC tablet Take 1 tablet (81 mg total) by mouth daily. Swallow whole. 03/03/21  Yes Schnier, Cordella MATSU, MD  atorvastatin  (LIPITOR) 10 MG tablet Take 10 mg by mouth daily. 09/21/19 11/04/24 Yes [provider]  lamoTRIgine  (LAMICTAL ) 200 MG tablet Take 400 mg by mouth at bedtime. 10/27/20  Yes [provider]  losartan (COZAAR) 25 MG tablet TAKE 2 TABLETS(50 MG) BY MOUTH DAILY 11/26/23  Yes [provider]  ondansetron  (ZOFRAN -ODT) 4 MG disintegrating tablet Take 1 tablet (4 mg total) by mouth every 6 (six) hours as needed for nausea or vomiting. 08/20/24  Yes Bernardino Ditch, NP  pantoprazole (PROTONIX) 20 MG tablet Take 1 tablet (20 mg total) by mouth daily. 08/20/24  Yes Bernardino Ditch, NP  QUEtiapine  (SEROQUEL ) 200 MG tablet Take 400 mg by mouth at bedtime. 02/11/21  Yes [provider]  albuterol  (VENTOLIN  HFA) 108 (90 Base) MCG/ACT inhaler Inhale 2 puffs into the lungs every 4 (four) hours as needed for wheezing or shortness of breath. 06/23/19   [provider]  apixaban  (ELIQUIS ) 2.5 MG TABS tablet Take 1 tablet (2.5 mg total) by mouth 2 (two) times daily. Patient not taking: Reported on 01/15/2022 03/03/21   Schnier,  Cordella MATSU, MD  atorvastatin  (LIPITOR) 10 MG tablet Take 1 tablet (10 mg total) by mouth daily. Patient not taking: Reported on 01/15/2022 03/01/21 03/01/22  Marea Selinda RAMAN, MD  traMADol  (ULTRAM ) 50 MG tablet Take 1 tablet (50 mg total) by mouth every 6 (six) hours as needed. Patient not taking: Reported on 01/15/2022 07/03/21   Marea Selinda RAMAN, MD    Family History History reviewed. No pertinent family history.  Social History Social History   Tobacco Use   Smoking status: Every Day    Types: Cigarettes   Smokeless tobacco: Never  Vaping Use   Vaping status: Never Used  Substance Use Topics   Alcohol use: Never   Drug use: Never     Allergies   Codeine, Oxycodone-acetaminophen , and Sulfa antibiotics   Review of Systems Review of Systems  Constitutional:  Positive for appetite change.  Respiratory:  Negative for shortness of breath.   Cardiovascular:  Negative for chest pain.  Gastrointestinal:  Positive for abdominal pain, nausea and vomiting.     Physical Exam Triage Vital Signs ED Triage Vitals  Encounter Vitals Group     BP      Girls Systolic BP Percentile      Girls Diastolic BP Percentile      Boys Systolic BP Percentile      Boys Diastolic BP Percentile      Pulse      Resp      Temp      Temp src      SpO2      Weight      Height      Head Circumference      Peak Flow      Pain Score      Pain Loc      Pain Education      Exclude from Growth Chart    No data found.  Updated Vital Signs BP (!) 158/59   Pulse 79   Temp 98 F (36.7 C)   Resp 18   SpO2 95%   Visual Acuity Right Eye Distance:   Left Eye Distance:   Bilateral Distance:    Right Eye Near:   Left Eye Near:    Bilateral Near:     Physical Exam Vitals and nursing note reviewed.  Constitutional:      Appearance: Normal appearance. She is ill-appearing.  HENT:     Head: Normocephalic and atraumatic.  Cardiovascular:     Rate and Rhythm: Normal rate and regular rhythm.      Pulses: Normal pulses.     Heart sounds: Normal heart sounds. No murmur heard.    No friction rub. No gallop.  Pulmonary:     Effort: Pulmonary effort is normal.     Breath sounds: Normal breath sounds. No wheezing, rhonchi or rales.  Abdominal:     General: There is distension.     Palpations: Abdomen is soft.     Tenderness: There is abdominal tenderness. There is  no guarding or rebound.     Comments: Marked, generalized abdominal tenderness as well as mild abdominal distention.  No focal findings, guarding, or rebound.  Skin:    General: Skin is warm and dry.     Capillary Refill: Capillary refill takes less than 2 seconds.     Findings: No rash.  Neurological:     General: No focal deficit present.     Mental Status: She is alert and oriented to person, place, and time.      UC Treatments / Results  Labs (all labs ordered are listed, but only abnormal results are displayed) Labs Reviewed  COMPREHENSIVE METABOLIC PANEL WITH GFR - Abnormal; Notable for the following components:      Result Value   Sodium 134 (*)    Chloride 97 (*)    Glucose, Bld 107 (*)    Creatinine, Ser 1.07 (*)    GFR, Estimated 56 (*)    All other components within normal limits  CBC WITH DIFFERENTIAL/PLATELET  LIPASE, BLOOD    EKG Normal sinus rhythm with a ventricular rate of 73 bpm PR interval 132 ms QRS duration 82 ms QT/QTc 398/438 ms Possible left atrial enlargement flattening of the ST segment and lead III and V2 as well as T wave inversions in V1.  Radiology No results found.  Procedures Procedures (including critical care time)  Medications Ordered in UC Medications  ondansetron  (ZOFRAN -ODT) disintegrating tablet 4 mg (4 mg Oral Given 08/20/24 1245)  alum & mag hydroxide-simeth (MAALOX/MYLANTA) 200-200-20 MG/5ML suspension 30 mL (30 mLs Oral Given 08/20/24 1305)  lidocaine (XYLOCAINE) 2 % viscous mouth solution 15 mL (15 mLs Mouth/Throat Given 08/20/24 1305)    Initial Impression  / Assessment and Plan / UC Course  I have reviewed the triage vital signs and the nursing notes.  Pertinent labs & imaging results that were available during my care of the patient were reviewed by me and considered in my medical decision making (see chart for details).   Patient is a pleasant, though mildly ill-appearing 68 year old female presenting for evaluation of what she is calling heartburn symptoms.  She reports that she is having pain in her stomach and a burning sensation up through her chest into the back of her throat.  She reports that she has had a decreased appetite and she says the pain gets so bad that she feels like she would feel better if she throws up.  She has thrown up without any improvement of symptoms.  She has been taking omeprazole, Pepto-Bismol, and Rolaids without improvement of symptoms.  She has no documented history of GERD and reports that she has never seen GI before.  She does not have any shortness of breath.  Cardiopulmonary exam reveals S1-S2 heart sounds with regular rate and rhythm and lung sounds are clear to auscultation in all fields.  Her abdomen is mildly distended and generally tender without focal finding.  Differential diagnosis includes gastritis, GERD, ACS, cholecystitis, pancreatitis, colitis.  I will order a CBC to evaluate for systemic infection, CMP to evaluate for any electrolyte abnormalities or elevation of transaminases, lipase to evaluate for possible cholecystitis or pancreatitis, and EKG to evaluate for ACS.  I have ordered 4 mg of Zofran  to be administered followed in 30 minutes by a GI cocktail.  EKG shows normal sinus rhythm with possible left atrial enlargement and ST abnormality.  No STEMI present.  No other tracings available for comparison in epic.  CBC is unremarkable with a normal  white count of 9.6, normal H&H of 14.6 and 42.7, normal platelets of 295.  CMP shows mild hyponatremia with a sodium of 134, normal potassium, mildly  decreased chloride at 97, elevated glucose at 107, and elevated creatinine of 1.07.  Transaminases are unremarkable.  Lipase normal at 19.  Following Zofran  and GI cocktail patient reports that her symptoms have resolved.  I will discharge her home on Zofran  with a diagnosis of gastritis.  I will have her start taking 20 mg of Pepcid  twice daily and switch her from omeprazole to pantoprazole 20 mg daily.  I also advised her that stopping smoking will also help decrease her chance of recurrent gastritis.   Final Clinical Impressions(s) / UC Diagnoses   Final diagnoses:  Gastritis and gastroduodenitis     Discharge Instructions      Stop taking your omeprazole and begin taking the pantoprazole 20 mg once daily to help control the stomach acid production and help control the inflammation in your stomach.  Along with the pantoprazole and when to take 20 mg of over-the-counter Pepcid  twice daily.  This will further decrease the acid production and help decrease stomach inflammation.  You may use the Zofran  every 6 hours as needed for nausea or vomiting.  Stopping smoking can be very helpful in preventing recurrence of gastritis or reflux disease.  Avoid fried, spicy, or greasy foods as these can all increase stomach irritation and increase your pain.  If you have a return of your symptoms you may feel free to return for reevaluation.  If you develop vomiting uncontrolled by the Zofran , start passing blood in your vomit or have blood in your stool, or fever you need to go to the ER for evaluation.     ED Prescriptions     Medication Sig Dispense Auth. Provider   ondansetron  (ZOFRAN -ODT) 4 MG disintegrating tablet Take 1 tablet (4 mg total) by mouth every 6 (six) hours as needed for nausea or vomiting. 20 tablet Bernardino Ditch, NP   pantoprazole (PROTONIX) 20 MG tablet Take 1 tablet (20 mg total) by mouth daily. 30 tablet Bernardino Ditch, NP      PDMP not reviewed this encounter.    Bernardino Ditch, NP 08/20/24 1354    Bernardino Ditch, NP 08/20/24 1742

## 2024-08-20 NOTE — ED Triage Notes (Signed)
 Patient to Urgent Care with complaints of heart burn/ emesis.   Symptoms x4 days.   Meds: omeprazole/ pepto-bismol/ Rolaids.
